# Patient Record
Sex: Male | Born: 1950 | Race: Black or African American | Hispanic: No | Marital: Single | Smoking: Current every day smoker
Health system: Southern US, Community
[De-identification: ages and names within clinical notes are randomized; demographics above are authoritative.]

## PROBLEM LIST (undated history)

## (undated) DIAGNOSIS — I10 Essential (primary) hypertension: Secondary | ICD-10-CM

## (undated) DIAGNOSIS — E785 Hyperlipidemia, unspecified: Secondary | ICD-10-CM

## (undated) HISTORY — DX: Hyperlipidemia, unspecified: E78.5

---

## 2009-11-09 ENCOUNTER — Emergency Department (HOSPITAL_COMMUNITY): Admission: EM | Admit: 2009-11-09 | Discharge: 2009-11-09 | Payer: Self-pay | Admitting: Emergency Medicine

## 2009-11-11 ENCOUNTER — Emergency Department (HOSPITAL_COMMUNITY): Admission: EM | Admit: 2009-11-11 | Discharge: 2009-11-11 | Payer: Self-pay | Admitting: Emergency Medicine

## 2009-11-11 ENCOUNTER — Encounter (INDEPENDENT_AMBULATORY_CARE_PROVIDER_SITE_OTHER): Payer: Self-pay | Admitting: *Deleted

## 2009-11-17 ENCOUNTER — Telehealth: Payer: Self-pay | Admitting: Gastroenterology

## 2009-11-17 ENCOUNTER — Emergency Department (HOSPITAL_COMMUNITY): Admission: EM | Admit: 2009-11-17 | Discharge: 2009-11-17 | Payer: Self-pay | Admitting: Emergency Medicine

## 2009-11-18 ENCOUNTER — Ambulatory Visit: Payer: Self-pay | Admitting: Gastroenterology

## 2009-11-18 DIAGNOSIS — R1012 Left upper quadrant pain: Secondary | ICD-10-CM | POA: Insufficient documentation

## 2009-11-18 DIAGNOSIS — R110 Nausea: Secondary | ICD-10-CM | POA: Insufficient documentation

## 2009-11-18 DIAGNOSIS — R634 Abnormal weight loss: Secondary | ICD-10-CM | POA: Insufficient documentation

## 2009-11-18 DIAGNOSIS — K589 Irritable bowel syndrome without diarrhea: Secondary | ICD-10-CM | POA: Insufficient documentation

## 2009-11-18 DIAGNOSIS — R933 Abnormal findings on diagnostic imaging of other parts of digestive tract: Secondary | ICD-10-CM | POA: Insufficient documentation

## 2009-11-26 ENCOUNTER — Ambulatory Visit: Payer: Self-pay | Admitting: Gastroenterology

## 2009-11-30 ENCOUNTER — Encounter: Payer: Self-pay | Admitting: Gastroenterology

## 2009-12-02 ENCOUNTER — Telehealth: Payer: Self-pay | Admitting: Gastroenterology

## 2009-12-11 ENCOUNTER — Telehealth: Payer: Self-pay | Admitting: Gastroenterology

## 2010-01-28 ENCOUNTER — Ambulatory Visit: Payer: Self-pay | Admitting: Internal Medicine

## 2010-01-28 DIAGNOSIS — A530 Latent syphilis, unspecified as early or late: Secondary | ICD-10-CM | POA: Insufficient documentation

## 2010-01-28 DIAGNOSIS — K219 Gastro-esophageal reflux disease without esophagitis: Secondary | ICD-10-CM | POA: Insufficient documentation

## 2010-01-28 DIAGNOSIS — F17200 Nicotine dependence, unspecified, uncomplicated: Secondary | ICD-10-CM | POA: Insufficient documentation

## 2010-01-28 DIAGNOSIS — I10 Essential (primary) hypertension: Secondary | ICD-10-CM | POA: Insufficient documentation

## 2010-01-28 DIAGNOSIS — N401 Enlarged prostate with lower urinary tract symptoms: Secondary | ICD-10-CM | POA: Insufficient documentation

## 2010-01-28 LAB — CONVERTED CEMR LAB
ALT: 18 units/L (ref 0–53)
Albumin: 3.8 g/dL (ref 3.5–5.2)
Amphetamine Screen, Ur: NEGATIVE
Barbiturate Quant, Ur: NEGATIVE
Basophils Relative: 1.2 % (ref 0.0–3.0)
Bilirubin Urine: NEGATIVE
Bilirubin, Direct: 0.1 mg/dL (ref 0.0–0.3)
CO2: 28 meq/L (ref 19–32)
Chloride: 111 meq/L (ref 96–112)
Eosinophils Relative: 1.5 % (ref 0.0–5.0)
HCT: 39.2 % (ref 39.0–52.0)
HCV Ab: NEGATIVE
Hemoglobin: 13.1 g/dL (ref 13.0–17.0)
Ketones, ur: NEGATIVE mg/dL
LDL Cholesterol: 107 mg/dL — ABNORMAL HIGH (ref 0–99)
Leukocytes, UA: NEGATIVE
Lymphs Abs: 1.3 10*3/uL (ref 0.7–4.0)
MCV: 85.2 fL (ref 78.0–100.0)
Marijuana Metabolite: NEGATIVE
Methadone: NEGATIVE
Monocytes Absolute: 0.5 10*3/uL (ref 0.1–1.0)
Neutro Abs: 5 10*3/uL (ref 1.4–7.7)
Neutrophils Relative %: 72.2 % (ref 43.0–77.0)
PSA: 1.54 ng/mL (ref 0.10–4.00)
Potassium: 4.4 meq/L (ref 3.5–5.1)
Propoxyphene: NEGATIVE
RBC: 4.6 M/uL (ref 4.22–5.81)
Sodium: 145 meq/L (ref 135–145)
Total CHOL/HDL Ratio: 3
Total Protein: 6.6 g/dL (ref 6.0–8.3)
WBC: 6.9 10*3/uL (ref 4.5–10.5)
pH: 6 (ref 5.0–8.0)

## 2010-01-29 ENCOUNTER — Encounter: Payer: Self-pay | Admitting: Internal Medicine

## 2010-02-02 ENCOUNTER — Telehealth: Payer: Self-pay | Admitting: Internal Medicine

## 2010-02-03 ENCOUNTER — Ambulatory Visit (HOSPITAL_COMMUNITY): Admission: RE | Admit: 2010-02-03 | Discharge: 2010-02-03 | Payer: Self-pay | Admitting: Internal Medicine

## 2010-02-03 ENCOUNTER — Telehealth: Payer: Self-pay | Admitting: Internal Medicine

## 2010-02-03 DIAGNOSIS — I774 Celiac artery compression syndrome: Secondary | ICD-10-CM | POA: Insufficient documentation

## 2010-02-24 ENCOUNTER — Ambulatory Visit: Payer: Self-pay | Admitting: Vascular Surgery

## 2010-02-24 ENCOUNTER — Encounter: Payer: Self-pay | Admitting: Internal Medicine

## 2010-02-26 ENCOUNTER — Telehealth: Payer: Self-pay | Admitting: Internal Medicine

## 2010-03-01 ENCOUNTER — Ambulatory Visit: Payer: Self-pay | Admitting: Internal Medicine

## 2010-03-01 DIAGNOSIS — K5900 Constipation, unspecified: Secondary | ICD-10-CM | POA: Insufficient documentation

## 2010-04-10 ENCOUNTER — Encounter: Payer: Self-pay | Admitting: Internal Medicine

## 2010-04-20 NOTE — Letter (Signed)
Summary: Patient Notice- Polyp Results  [facility]  [address] [place], [place] [address]   Phone: [phone]  Fax: [phone]        November 30, 2009 MRN: [id number]    MATEUS REWERTS [address] [place], [place]  [address]    Dear Mr. SEARFOSS,  I am pleased to inform you that the colon polyp(s) removed during your recent colonoscopy was (were) found to be benign (no cancer detected) upon pathologic examination.  I recommend you have a repeat colonoscopy examination in 5 years to look for recurrent polyps, as having colon polyps increases your risk for having recurrent polyps or even colon cancer in the future.  Should you develop new or worsening symptoms of abdominal pain, bowel habit changes or bleeding from the rectum or bowels, please schedule an evaluation with either your primary care physician or with me.  Continue treatment plan as outlined the day of your exam.  Please call us if you are having persistent problems or have questions about your condition that have not been fully answered at this time.  Sincerely,  Meryl Dare MD [facility]  This letter has been electronically signed by your physician.  Appended Document: Patient Notice- Polyp Results letter mailed.

## 2010-04-20 NOTE — Assessment & Plan Note (Signed)
Summary: Abd. pain   History of Present Illness Visit Type: Initial Consult Primary GI MD: Elie Goody MD [facility] Primary Provider: na Requesting Provider: Bethann Berkshire, MD Chief Complaint: Patient following up from the ER for his abdominal pain and constipation. He has been taking epson salt and ducolax. He states that this happened in 1983 and he says it was gas. He was inpt then for the extreme gas, Per patient.  History of Present Illness:   60 YO MALE NEW TO GI TODAY, REFERRED VIA [facility] ER. HE HAS HAD 3 ER VISITS IN THE PAST 2 WEEKS WITH ABDOMINAL PAIN. HE HAS NO GI HX, OTHERWISE HAS BEEN HEALTHY. HE HAD ONSET ABOUT 2 WEEKS AGO WITH EPIGASTRIC/LUQ PAIN, UP UNDER HIS RIBS. THIS HAS BEEN CONSTANT,SOMETIMES WORSE AT NIGHT,UNABLE TO LAY ON HIS LEFT SIDE. HE FEELS WORSE IMMEDIATELY AFTER EATING WITH DULL PAIN. HE FEELS FULL,LIKE NOT DIGESTING HIS FOOD. NAUSEA/NO VOMITING, WEIGHT DOWN, NOT SURE HOW MANY POUNDS BUT HAS ALSO BEEN WORKING OUTSIDE ALL SUMMER. BM'S HAVE BEEN NORMAL, NO MELENA OR HEME. HE TOOK EPSOM SALTS THE OTHER DAY TO PURGE BOWEL, NO CHANGE IN PAIN. DENIES ETOH, NO ASA/NSAIDS.  IN ER 8/30: KUB NEGATIVE &  CT ABD/PELVIS WITH CONTRAST SHOWS MULTIPLE SMALL LOW DENSITY LESIONS IN THE LIVER, ?SMALL CYSTS CANNOT R/O MALIGNANCY. LABS 8/30 CBC NORMAL CREAT 1.42, HEPATIC PANEL NORMAL, LIPASE 28. HE HAS ULTRAM FOR PAIN, AND WAS GIVEN RX FOR ZANTAC.   GI Review of Systems    Reports abdominal pain, bloating, loss of appetite, nausea, and  weight loss.     Location of  Abdominal pain: right side. Weight loss of 10 pounds over 2 weeks.   Denies acid reflux, belching, chest pain, dysphagia with liquids, dysphagia with solids, heartburn, vomiting, vomiting blood, and  weight gain.      Reports constipation.     Denies anal fissure, black tarry stools, change in bowel habit, diarrhea, diverticulosis, fecal incontinence, heme positive stool, hemorrhoids, irritable bowel syndrome, jaundice, light  color stool, liver problems, rectal bleeding, and  rectal pain. Preventive Screening-Counseling & Management  Alcohol-Tobacco     Smoking Status: current      Drug Use:  no.     Current Medications (verified): 1)  Zantac 150 Mg Tabs (Ranitidine Hcl) .Marland Kitchen.. 1 By Mouth Two Times A Day 2)  Ultram 50 Mg Tabs (Tramadol Hcl) .Marland Kitchen.. 1 By Mouth Every 4-6 Hours 3)  Epsom Salt  Gran (Magnesium Sulfate (Laxative)) .... Use As Needed  Allergies (verified): No Known Drug Allergies  Past History:  Past Medical History: Reviewed history from 11/18/2009 and no changes required. Unremarkable  Past Surgical History: Reviewed history from 11/18/2009 and no changes required. Unremarkable  Family History: Family History of Heart Disease: mother, father No FH of Colon Cancer: paternal uncle unknown type cancer  Social History: Occupation: self employed Patient currently smokes. 1.5 ppd Alcohol Use - no Illicit Drug Use - no Smoking Status:  current Drug Use:  no  Review of Systems       The patient complains of sleeping problems.  The patient denies allergy/sinus, anemia, anxiety-new, arthritis/joint pain, back pain, blood in urine, breast changes/lumps, change in vision, confusion, cough, coughing up blood, depression-new, fainting, fatigue, fever, headaches-new, hearing problems, heart murmur, heart rhythm changes, itching, muscle pains/cramps, night sweats, nosebleeds, shortness of breath, skin rash, sore throat, swelling of feet/legs, swollen lymph glands, thirst - excessive, urination - excessive, urination changes/pain, urine leakage, vision changes, and voice change.  SEE HPI  Vital Signs:  Patient profile:   60 year old male Height:      71 inches Weight:      153.8 pounds BMI:     21.53 Pulse rate:   76 / minute Pulse rhythm:   regular BP sitting:   114 / 72  (left arm) Cuff size:   regular  Vitals Entered By: Harlow Mares CMA Duncan Dull) (November 18, 2009 1:32  PM)  Physical Exam  General:  Well developed, well nourished, no acute distress.,THIN Head:  Normocephalic and atraumatic. Eyes:  PERRLA, no icterus. Lungs:  Clear throughout to auscultation. Heart:  Regular rate and rhythm; no murmurs, rubs,  or bruits. Abdomen:  SOFT,FLAT, TENDER EPIGASTRIUM/AND LUQ, NO GUARDING, NO MASS OR HSM,NO SPLASH,BS+,, NO TENDERNESS OVER LEFT LOWER RIBS Rectal:  NOT DONE,HEME NEGATIVE IN ER Neurologic:  Alert and  oriented x4;  grossly normal neurologically. Psych:  Alert and cooperative. Normal mood and affect.  Impression & Recommendations:  Problem # 1:  ABDOMINAL PAIN, LEFT UPPER QUADRANT (ICD-789.02) Assessment New 59 YO MALE WITH 2 WEEK HX OF LUQ PAIN,NAUSEA,WEIGHT LOSS . RECENT CT WITH MULTIPLE INDETERMINATE TINY LIVER LESIONS; CYSTS VS ?METS.  R/O PUD, R/O GASTRIC OR COLON LESION. USE ULTRAM AS NEEDED FOR PAIN TRIAL OF ACIPHEX 20 MG TWICE DAILY(SAMPLES GIVEN) AND HOLD ZANTAC. SMALL FREQUENT BLAND MEALS SCHEDULE FOR EGD AND COLONOSCOPY WITH DR. Russella Dar ASAP. PROCEDURES DISCUSSED IN DETAIL WITH PT, INCLUDING RISKS, BENEFITS, ALTERNATIVES AND HE IS AGREEABLE . Orders: Colon/Endo (Colon/Endo)  Problem # 2:  ABNORMAL FINDINGS GI TRACT (ICD-793.4) Assessment: Unchanged ABNORMAL CT OF LIVER AS  ABOVE. MAY NEED MRI DEPENDING ON FINDINGS OF EGD AND COLON  Problem # 3:  SMOKER Assessment: Comment Only DC SMOKING.  Patient Instructions: 1)  We have given you samples of Aciphex tablets.  Take 1 tab 30 min before breakfast anad 1 tab before dinner. 2)  Eat frequent bland, small, meals.   3)  Continue the the Ultram for pain.  4)  We scheduled the Colonoscopy/Endoscopy with Dr Russella Dar on 11-26-09. 5)  Directions and brochure provided. 6)  [facility] Patient Information Guide given to patient. 7)  The medication list was reviewed and reconciled.  All changed / newly prescribed medications were explained.  A complete medication list was provided to  the patient / caregiver.  Prescriptions: MOVIPREP 100 GM  SOLR (PEG-KCL-NACL-NASULF-NA ASC-C) As per prep instructions.  #1 x 0   Entered by:   Lowry Ram NCMA   Authorized by:   Sammuel Cooper PA-c   Signed by:   Lowry Ram NCMA on 11/18/2009   Method used:   Electronically to        [facility] [phone]* (retail)       [address]       [place], [place]  [address]       Ph: [phone]       Fax: [phone]   RxID:   [id number]

## 2010-04-20 NOTE — Progress Notes (Signed)
Summary: triage  Phone Note From Other Clinic Call back at [phone]   Caller: [facility] Misty Stanley--- Dr Bethann Berkshire Call For: DOD Reason for Call: Schedule Patient Appt Summary of Call: Dr Estell Harpin wants this patient seen tomorrow for severe abd pain possible ulcer related. Please call the patient. Initial call taken by: Tawni Levy,  November 17, 2009 3:08 PM  Follow-up for Phone Call        Misty Stanley from Er.given appt. time for pt.for 11/18/09 at 1:30 p.m. per request of Dr.Joseph Zammitt and I asked her to inform the pt.to bring $184.00 for visit as he is self-pay.Per Misty Stanley she said pt. has no G.i. hx. Follow-up by: Teryl Lucy RN,  November 17, 2009 3:39 PM

## 2010-04-20 NOTE — Procedures (Signed)
Summary: Colonoscopy  Patient: Jesse Williams Note: All result statuses are Final unless otherwise noted.  Tests: (1) Colonoscopy (COL)   COL Colonoscopy           DONE     [facility]     [address]     [place], [place]  [address]           COLONOSCOPY PROCEDURE REPORT           PATIENT:  Jesse Williams, Jesse Williams  MR#:  [id number]     BIRTHDATE:  Apr 13, 1950, 59 yrs. old  GENDER:  male     ENDOSCOPIST:  Judie Petit T. Russella Dar, MD, [facility]     Referred by:  Valeria Batman, M.D.     PROCEDURE DATE:  11/26/2009     PROCEDURE:  Colonoscopy with biopsy and snare polypectomy     ASA CLASS:  Class II     INDICATIONS:  1) Abnormal CT of abdomen  2) weight loss     MEDICATIONS:   Fentanyl 75 mcg IV, Versed 7 mg IV     DESCRIPTION OF PROCEDURE:   After the risks benefits and     alternatives of the procedure were thoroughly explained, informed     consent was obtained.  Digital rectal exam was performed and     revealed no abnormalities.   The LB [id number]-[id number] endoscope     was introduced through the anus and advanced to the cecum, which     was identified by both the appendix and ileocecal valve, without     limitations.  The quality of the prep was good, using MoviPrep.     The instrument was then slowly withdrawn as the colon was fully     examined.     <<PROCEDUREIMAGES>>     FINDINGS:  A sessile polyp was found at the hepatic flexure. It     was 4 mm in size. The polyp was removed using cold biopsy forceps.     A sessile polyp was found in the proximal transverse colon. It was     5 mm in size. Polyp was snared without cautery. Retrieval was     successful. A pedunculated polyp was found in the descending     colon. It was 9 mm in size. Polyp was snared, then cauterized with     monopolar cautery. Retrieval was successful.   Mild diverticulosis     was found in the sigmoid colon. This was otherwise a normal     examination of the colon.  Retroflexed views in the rectum     revealed  internal hemorrhoids, small.  The time to cecum =  3.5     minutes. The scope was then withdrawn (time =  9.5  min) from the     patient and the procedure completed.           COMPLICATIONS:  None           ENDOSCOPIC IMPRESSION:     1) 4 mm sessile polyp at the hepatic flexure     2) 5 mm sessile polyp in the proximal transverse colon     3) 9 mm pedunculated polyp in the descending colon     4) Mild diverticulosis     5) Internal hemorrhoids     RECOMMENDATIONS:     1) No aspirin or NSAID's for 2 weeks     2) Await pathology results     3) GasX qid prn  Venita Lick. Russella Dar, MD, [facility]           n.     eSIGNED:   Venita Lick. Stark at 11/26/2009 08:35 AM           [name], [id number]  Note: An exclamation mark (!) indicates a result that was not dispersed into the flowsheet. Document Creation Date: 11/26/2009 8:36 AM _______________________________________________________________________  (1) Order result status: Final Collection or observation date-time: 11/26/2009 08:22 Requested date-time:  Receipt date-time:  Reported date-time:  Referring Physician:   Ordering Physician: Claudette Head [phone]) Specimen Source:  Source: [name] Order Number: [id number] Lab site:   Appended Document: Colonoscopy     Procedures Next Due Date:    Colonoscopy: 11/2014

## 2010-04-20 NOTE — Progress Notes (Signed)
Summary: MRA  Phone Note From Other Clinic   Caller: [facility], [facility] Summary of Call: Clifton Custard with [facility] called to inform pt. is scheduled for MRA tomorrow ( 02/03/2010) of the Abdomen and Pelvis. He informed that doing just the abdomen should be able to cover what he needs and possible a MRA of pelvis not necessary. Please Advise. Call back number [phone] Initial call taken by: Robin Ewing CMA Duncan Dull),  February 02, 2010 1:10 PM  Follow-up for Phone Call        ok to change to MRA abd only Follow-up by: Corwin Levins MD,  February 02, 2010 1:12 PM  Additional Follow-up for Phone Call Additional follow up Details #1::        New order in EMR [facility] aware Additional Follow-up by: Lamar Sprinkles, CMA,  February 02, 2010 2:59 PM

## 2010-04-20 NOTE — Progress Notes (Signed)
     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

## 2010-04-20 NOTE — Letter (Signed)
Summary: [facility] Instructions  [facility]  [address] [place], [place] [address]   Phone: [phone]  Fax: [phone]       FREEDOM PEDDY    Feb 28, 1963    MRN: [id number]        Procedure Day /Date: 11-26-09     Arrival Time: 7:30 AM      Procedure Time :8:00 AM     Location of Procedure:                    X     [facility] (4th Floor)                    PREPARATION FOR COLONOSCOPY WITH MOVIPREP   Starting 5 days prior to your procedure 11-21-09 do not eat nuts, seeds, popcorn, corn, beans, peas,  salads, or any raw vegetables.  Do not take any fiber supplements (e.g. Metamucil, Citrucel, and Benefiber).  THE DAY BEFORE YOUR PROCEDURE         DATE: 11-25-09  DAY: WEDNESDAY  1.  Drink clear liquids the entire day-NO SOLID FOOD  2.  Do not drink anything colored red or purple.  Avoid juices with pulp.  No orange juice.  3.  Drink at least 64 oz. (8 glasses) of fluid/clear liquids during the day to prevent dehydration and help the prep work efficiently.  CLEAR LIQUIDS INCLUDE: Water Jello Ice Popsicles Tea (sugar ok, no milk/cream) Powdered fruit flavored drinks Coffee (sugar ok, no milk/cream) Gatorade Juice: apple, white grape, white cranberry  Lemonade Clear bullion, consomm, broth Carbonated beverages (any kind) Strained chicken noodle soup Hard Candy                             4.  In the morning, mix first dose of MoviPrep solution:    Empty 1 Pouch A and 1 Pouch B into the disposable container    Add lukewarm drinking water to the top line of the container. Mix to dissolve    Refrigerate (mixed solution should be used within 24 hrs)  5.  Begin drinking the prep at 5:00 p.m. The MoviPrep container is divided by 4 marks.   Every 15 minutes drink the solution down to the next mark (approximately 8 oz) until the full liter is complete.   6.  Follow completed prep with 16 oz of clear liquid of your choice (Nothing red or  purple).  Continue to drink clear liquids until bedtime.  7.  Before going to bed, mix second dose of MoviPrep solution:    Empty 1 Pouch A and 1 Pouch B into the disposable container    Add lukewarm drinking water to the top line of the container. Mix to dissolve    Refrigerate  THE DAY OF YOUR PROCEDURE      DATE: 11-26-09 DAY: THURSDAY  Beginning at 3:00 am (5 hours before procedure):         1. Every 15 minutes, drink the solution down to the next mark (approx 8 oz) until the full liter is complete.  2. Follow completed prep with 16 oz. of clear liquid of your choice.    3. You may drink clear liquids until 6:00 am  (2 HOURS BEFORE PROCEDURE).   MEDICATION INSTRUCTIONS  Unless otherwise instructed, you should take regular prescription medications with a small sip of water   as early as possible the morning of your  procedure.       OTHER INSTRUCTIONS  You will need a responsible adult at least 62 years of age to accompany you and drive you home.   This person must remain in the waiting room during your procedure.  Wear loose fitting clothing that is easily removed.  Leave jewelry and other valuables at home.  However, you may wish to bring a book to read or  an iPod/MP3 player to listen to music as you wait for your procedure to start.  Remove all body piercing jewelry and leave at home.  Total time from sign-in until discharge is approximately 2-3 hours.  You should go home directly after your procedure and rest.  You can resume normal activities the  day after your procedure.  The day of your procedure you should not:   Drive   Make legal decisions   Operate machinery   Drink alcohol   Return to work  You will receive specific instructions about eating, activities and medications before you leave.    The above instructions have been reviewed and explained to me by   _______________________    I fully understand and can verbalize these instructions  _____________________________ Date _________

## 2010-04-20 NOTE — Assessment & Plan Note (Signed)
Summary: New / [facility] / # / cd   Vital Signs:  Patient profile:   60 year old male Height:      71 inches Weight:      171 pounds BMI:     23.94 O2 Sat:      94 % on Room air Temp:     98.0 degrees F oral Pulse rate:   74 / minute Pulse rhythm:   regular Resp:     16 per minute BP sitting:   138 / 88  (left arm) Cuff size:   large  Vitals Entered By: Rock Nephew CMA (January 28, 2010 4:19 PM)  O2 Flow:  Room air CC: New to establish, Preventive Care, Abdominal Pain   Primary Care Provider:  Etta Grandchild MD  CC:  New to establish, Preventive Care, and Abdominal Pain.  History of Present Illness: New to me he complains of epigastric and LUQ post-prandial abd pain for about 3 months. He has had an unremarkable CT scan and pan-endoscopy yet his symptoms persist. He has lost an unspecified amount of weight.  Dyspepsia History:      The patient has positive alarm features of dyspepsia which include involuntary weight loss >5%.  There is a prior history of GERD.  The patient does not have a prior history of documented ulcer disease.  The dominant symptom is heartburn or acid reflux.  An H-2 blocker medication is currently being taken.  He notes that the symptoms have improved with the H-2 blocker therapy.  Symptoms have not persisted after 4 weeks of H-2 blocker treatment.  A prior EGD has been done.     Preventive Screening-Counseling & Management  Alcohol-Tobacco     Alcohol drinks/day: <1     Alcohol type: spirits     >5/day in last 3 mos: no     Alcohol Counseling: not indicated; use of alcohol is not excessive or problematic     Feels need to cut down: no     Feels annoyed by complaints: no     Feels guilty re: drinking: no     Needs 'eye opener' in am: no     Smoking Status: current     Smoking Cessation Counseling: yes     Smoke Cessation Stage: precontemplative     Packs/Day: 1.5     Year Started: 1966     Pack years: 40     Tobacco  Counseling: to quit use of tobacco products  Hep-HIV-STD-Contraception     Hepatitis Risk: no risk noted     HIV Risk: no risk noted     STD Risk: no risk noted      Sexual History:  currently monogamous.        Drug Use:  current, crack cocaine, and marijuana.        Blood Transfusions:  no.    Current Medications (verified): 1)  Ultram 50 Mg Tabs (Tramadol Hcl) .Marland Kitchen.. 1 By Mouth Every 4-6 Hours 2)  Epsom Salt  Gran (Magnesium Sulfate (Laxative)) .... Use As Needed 3)  Prilosec Otc 20 Mg  Tbec (Omeprazole Magnesium) .Marland Kitchen.. 1 Each Day 30 Minutes Before Breakfast.  Allergies (verified): No Known Drug Allergies  Past History:  Past Medical History: Hypertension GERD Syphyllis  Past Surgical History: Reviewed history from 11/18/2009 and no changes required. Unremarkable  Social History: Packs/Day:  1.5 Hepatitis Risk:  no risk noted HIV Risk:  no risk noted STD Risk:  no risk noted Drug  Use:  current, crack cocaine, marijuana Sexual History:  currently monogamous Blood Transfusions:  no  Review of Systems       The patient complains of anorexia, weight loss, and abdominal pain.  The patient denies fever, hoarseness, chest pain, syncope, dyspnea on exertion, peripheral edema, prolonged cough, headaches, hemoptysis, melena, hematochezia, severe indigestion/heartburn, hematuria, genital sores, suspicious skin lesions, difficulty walking, depression, abnormal bleeding, enlarged lymph nodes, angioedema, and testicular masses.   General:  Complains of loss of appetite; denies chills, fatigue, fever, malaise, sleep disorder, sweats, weakness, and weight loss. GI:  Complains of abdominal pain, constipation, gas, and loss of appetite; denies bloody stools, dark tarry stools, hemorrhoids, indigestion, nausea, vomiting, vomiting blood, and yellowish skin color. GU:  Complains of nocturia, urinary frequency, and urinary hesitancy; denies discharge, dysuria, and hematuria.  Physical  Exam  General:  alert, well-developed, well-nourished, well-hydrated, appropriate dress, normal appearance, healthy-appearing, cooperative to examination, and good hygiene.   Head:  normocephalic, atraumatic, no abnormalities observed, and no abnormalities palpated.   Eyes:  vision grossly intact, pupils equal, pupils round, and pupils reactive to light.   Nose:  External nasal examination shows no deformity or inflammation. Nasal mucosa are pink and moist without lesions or exudates. Mouth:  Oral mucosa and oropharynx without lesions or exudates.  Teeth in good repair. Neck:  supple, full ROM, no masses, no thyromegaly, no JVD, normal carotid upstroke, no carotid bruits, no cervical lymphadenopathy, and no neck tenderness.   Breasts:  No masses or gynecomastia noted Lungs:  normal respiratory effort, no intercostal retractions, no accessory muscle use, normal breath sounds, no dullness, no fremitus, no crackles, and no wheezes.   Heart:  normal rate, regular rhythm, no murmur, no gallop, no rub, and no JVD.   Abdomen:  soft, non-tender, normal bowel sounds, no distention, no masses, no guarding, no rigidity, no rebound tenderness, no abdominal hernia, no inguinal hernia, no hepatomegaly, and no splenomegaly.   Rectal:  No external abnormalities noted. Normal sphincter tone. No rectal masses or tenderness. Genitalia:  circumcised, no hydrocele, no varicocele, no scrotal masses, no testicular masses or atrophy, no cutaneous lesions, and no urethral discharge.   Prostate:  no nodules, no asymmetry, no induration, and 2+ enlarged.   Msk:  normal ROM, no joint tenderness, no joint swelling, no joint warmth, no redness over joints, no joint deformities, no joint instability, no crepitation, and no muscle atrophy.   Pulses:  R and L carotid,radial,femoral,dorsalis pedis and posterior tibial pulses are full and equal bilaterally Extremities:  No clubbing, cyanosis, edema, or deformity noted with normal  full range of motion of all joints.   Neurologic:  No cranial nerve deficits noted. Station and gait are normal. Plantar reflexes are down-going bilaterally. DTRs are symmetrical throughout. Sensory, motor and coordinative functions appear intact. Skin:  turgor normal, color normal, no rashes, no suspicious lesions, no ecchymoses, no petechiae, no purpura, no ulcerations, and no edema.   Cervical Nodes:  no anterior cervical adenopathy and no posterior cervical adenopathy.   Axillary Nodes:  no R axillary adenopathy and no L axillary adenopathy.   Inguinal Nodes:  no R inguinal adenopathy and no L inguinal adenopathy.   Psych:  Cognition and judgment appear intact. Alert and cooperative with normal attention span and concentration. No apparent delusions, illusions, hallucinations   Impression & Recommendations:  Problem # 1:  SYPHILIS, LATENT (ICD-097.1) Assessment New  Orders: T-Drug Screen-Urine, (single) [phone]) T-HIV Antibody  (Reflex) [id number]) T-RPR (Syphilis) (30865-78469) T-Hepatitis C Antibody (  [phone]) Venipuncture [phone]) TLB-Lipid Panel (80061-LIPID) TLB-BMP (Basic Metabolic Panel-BMET) (80048-METABOL) TLB-CBC Platelet - w/Differential (85025-CBCD) TLB-Hepatic/Liver Function Pnl (80076-HEPATIC) TLB-TSH (Thyroid Stimulating Hormone) (84443-TSH) TLB-PSA (Prostate Specific Antigen) (84153-PSA) TLB-Udip w/ Micro (81001-URINE)  Problem # 2:  LOSS OF WEIGHT (WNU-272.53) Assessment: Unchanged  Orders: T-Drug Screen-Urine, (single) (66440-34742) T-HIV Antibody  (Reflex) [id number]) T-RPR (Syphilis) (33295-18841) T-Hepatitis C Antibody (66063-01601) Venipuncture (09323) TLB-Lipid Panel (80061-LIPID) TLB-BMP (Basic Metabolic Panel-BMET) (80048-METABOL) TLB-CBC Platelet - w/Differential (85025-CBCD) TLB-Hepatic/Liver Function Pnl (80076-HEPATIC) TLB-TSH (Thyroid Stimulating Hormone) (84443-TSH) TLB-PSA (Prostate Specific Antigen) (84153-PSA) TLB-Udip w/  Micro (81001-URINE)  Problem # 3:  HYPERTENSION (ICD-401.9) Assessment: Improved  Orders: T-Drug Screen-Urine, (single) [phone]) T-HIV Antibody  (Reflex) [id number]) T-RPR (Syphilis) [phone]) T-Hepatitis C Antibody (37106-26948) Venipuncture (54627) TLB-Lipid Panel (80061-LIPID) TLB-BMP (Basic Metabolic Panel-BMET) (80048-METABOL) TLB-CBC Platelet - w/Differential (85025-CBCD) TLB-Hepatic/Liver Function Pnl (80076-HEPATIC) TLB-TSH (Thyroid Stimulating Hormone) (84443-TSH) TLB-PSA (Prostate Specific Antigen) (84153-PSA) TLB-Udip w/ Micro (81001-URINE) Tobacco use cessation intermediate 3-10 minutes (99406)  BP today: 138/88 Prior BP: 114/72 (11/18/2009)  Problem # 4:  ABDOMINAL PAIN, LEFT UPPER QUADRANT (ICD-789.02) Assessment: Unchanged I think he may have mesenteric ischemia, will order an MR angio Orders: T-Drug Screen-Urine, (single) (03500-93818) T-HIV Antibody  (Reflex) [id number]) T-RPR (Syphilis) (89381-01751) T-Hepatitis C Antibody (02585-27782) Venipuncture (42353) TLB-Lipid Panel (80061-LIPID) TLB-BMP (Basic Metabolic Panel-BMET) (80048-METABOL) TLB-CBC Platelet - w/Differential (85025-CBCD) TLB-Hepatic/Liver Function Pnl (80076-HEPATIC) TLB-TSH (Thyroid Stimulating Hormone) (84443-TSH) TLB-PSA (Prostate Specific Antigen) (84153-PSA) TLB-Udip w/ Micro (81001-URINE) Radiology Referral (Radiology) Hemoccult Guaiac-1 spec.(in office) (82270)  Problem # 5:  TOBACCO USE (ICD-305.1) Assessment: New  Encouraged smoking cessation and discussed different methods for smoking cessation.   Orders: Tobacco use cessation intermediate 3-10 minutes (61443)  Problem # 6:  HYPERTROPHY PROSTATE W/UR OBST & OTH LUTS (ICD-600.01) Assessment: New  Complete Medication List: 1)  Ultram 50 Mg Tabs (Tramadol hcl) .Marland Kitchen.. 1 by mouth every 4-6 hours 2)  Epsom Salt Gran (Magnesium sulfate (laxative)) .... Use as needed 3)  Prilosec Otc 20 Mg Tbec (Omeprazole magnesium)  .Marland Kitchen.. 1 each day 30 minutes before breakfast.  Colorectal Screening:  Current Recommendations:    Hemoccult: NEG X 1 today  PSA Screening:    Reviewed PSA screening recommendations: PSA ordered  Patient Instructions: 1)  Please schedule a follow-up appointment in 2 weeks. 2)  Tobacco is very bad for your health and your loved ones! You Should stop smoking!. 3)  Stop Smoking Tips: Choose a Quit date. Cut down before the Quit date. decide what you will do as a substitute when you feel the urge to smoke(gum,toothpick,exercise). 4)  Check your Blood Pressure regularly. If it is above 140/90: you should make an appointment.   Orders Added: 1)  T-Drug Screen-Urine, (single) [80101-82900] 2)  T-HIV Antibody  (Reflex) [[id number]] 3)  T-RPR (Syphilis) [95093-26712] 4)  T-Hepatitis C Antibody [45809-98338] 5)  Venipuncture [36415] 6)  TLB-Lipid Panel [80061-LIPID] 7)  TLB-BMP (Basic Metabolic Panel-BMET) [80048-METABOL] 8)  TLB-CBC Platelet - w/Differential [85025-CBCD] 9)  TLB-Hepatic/Liver Function Pnl [80076-HEPATIC] 10)  TLB-TSH (Thyroid Stimulating Hormone) [84443-TSH] 11)  TLB-PSA (Prostate Specific Antigen) [84153-PSA] 12)  TLB-Udip w/ Micro [81001-URINE] 13)  Radiology Referral [Radiology] 14)  New Patient Level V [99205] 15)  Hemoccult Guaiac-1 spec.(in office) [82270] 16)  Tobacco use cessation intermediate 3-10 minutes [99406]

## 2010-04-20 NOTE — Letter (Signed)
Summary: Lipid Letter  [facility]  [address] [place], [place] [address]   Phone: [phone]  Fax: [phone]    01/29/2010  Jerimiah Wolman [address] [place], [place]  [address]  Dear Peyton Najjar:  We have carefully reviewed your last lipid profile from 01/28/2010 and the results are noted below with a summary of recommendations for lipid management.    Cholesterol:       184     Goal: <200   HDL "good" Cholesterol:   62.95     Goal: >40   LDL "bad" Cholesterol:   107     Goal: <130   Triglycerides:       55.0     Goal: <150    other labs look good, prostate test is normal    TLC Diet (Therapeutic Lifestyle Change): Saturated Fats & Transfatty acids should be kept < 7% of total calories ***Reduce Saturated Fats Polyunstaurated Fat can be up to 10% of total calories Monounsaturated Fat Fat can be up to 20% of total calories Total Fat should be no greater than 25-35% of total calories Carbohydrates should be 50-60% of total calories Protein should be approximately 15% of total calories Fiber should be at least 20-30 grams a day ***Increased fiber may help lower LDL Total Cholesterol should be < 200mg /day Consider adding plant stanol/sterols to diet (example: Benacol spread) ***A higher intake of unsaturated fat may reduce Triglycerides and Increase HDL    Adjunctive Measures (may lower LIPIDS and reduce risk of Heart Attack) include: Aerobic Exercise (20-30 minutes 3-4 times a week) Limit Alcohol Consumption Weight Reduction Aspirin 75-81 mg a day by mouth (if not allergic or contraindicated) Dietary Fiber 20-30 grams a day by mouth     Current Medications: 1)    Ultram 50 Mg Tabs (Tramadol hcl) .Marland Kitchen.. 1 by mouth every 4-6 hours 2)    Epsom Salt  Gran (Magnesium sulfate (laxative)) .... Use as needed 3)    Prilosec Otc 20 Mg  Tbec (Omeprazole magnesium) .Marland Kitchen.. 1 each day 30 minutes before breakfast.  If you have any questions, please call. We appreciate  being able to work with you.   Sincerely,    [facility] Etta Grandchild MD

## 2010-04-20 NOTE — Progress Notes (Signed)
Summary: triage  Phone Note Call from Patient Call back at [phone]   Caller: neighbor, Jamesetta So Call For: Dr. Russella Dar Reason for Call: Talk to Nurse Summary of Call: pt still having moving abd pain and sometimes back pain... pt also says he is still waiting on biopsy results... constipated and wants to know if he can take a laxative Initial call taken by: Vallarie Mare,  December 02, 2009 10:27 AM  Follow-up for Phone Call        I have spoke with the patient .  C/O continued abdominal pain and gas. He has not been consistently taking gas-x, or Prilosec.  He did have a small BM last night.  He is advised to start Miralax 1-3 times a day as needed for constipation.  Reviewed path results with him and a low gas diet.  He will call back for continued problems.   Follow-up by: Darcey Nora RN, CGRN,  December 02, 2009 11:14 AM  Additional Follow-up for Phone Call Additional follow up Details #1::        Agree. Additional Follow-up by: Meryl Dare MD Clementeen Graham,  December 02, 2009 2:57 PM

## 2010-04-20 NOTE — Progress Notes (Signed)
Summary: Triage  Phone Note Call from Patient   Caller: Jesse Williams   [phone] Call For: Dr. Russella Dar Reason for Call: Talk to Nurse Summary of Call: pt. is having left side rib pain Initial call taken by: Karna Christmas,  December 11, 2009 1:31 PM  Follow-up for Phone Call        Pt. had an Endo/Colon 11-26-09. Pt. continues with same c/o for 2 monthes. Pain under his Left ribs that goes around to his back. Pain is always there at night and when he lays still, is relieved somewhat when he moves around. Some bloating, and  can only have a BM when he takes Epsom Salts. Denies n/v, diarhea, blood, black stools, fever. Takes Prilosec 20mg  QAM. (needs script sent to pharmacy for Prilosec)  DR.Leopoldo Mazzie PLEASE ADVISE  Follow-up by: Laureen Ochs LPN,  December 11, 2009 2:02 PM  Additional Follow-up for Phone Call Additional follow up Details #1::        Add Gas-X qid as needed for gas and bloating Miralax once daily for constipation Needs PCP evaluation for possible left sided musculoskelatal pain, no GI disorders found on recent work up to account for the pain Additional Follow-up by: Meryl Dare MD Clementeen Graham,  December 11, 2009 2:10 PM    Additional Follow-up for Phone Call Additional follow up Details #2::    Above MD orders reviewed with patient. I transfered his call to [facility] for him to schedule an appt. to see a PCP. Pt. instructed to call back as needed.  Follow-up by: Laureen Ochs LPN,  December 11, 2009 2:38 PM  New/Updated Medications: PRILOSEC OTC 20 MG  TBEC (OMEPRAZOLE MAGNESIUM) 1 each day 30 minutes before breakfast. Prescriptions: PRILOSEC OTC 20 MG  TBEC (OMEPRAZOLE MAGNESIUM) 1 each day 30 minutes before breakfast.  #30 x 2   Entered by:   Laureen Ochs LPN   Authorized by:   Meryl Dare MD [facility]   Signed by:   Laureen Ochs LPN on 46/96/2952   Method used:   Electronically to        [facility] [phone]* (retail)       [address]       [place], [place]  [address]       Ph: [phone]       Fax: [phone]   RxID:   [id number]

## 2010-04-20 NOTE — Procedures (Signed)
Summary: Upper Endoscopy  Patient: [name] Note: All result statuses are Final unless otherwise noted.  Tests: (1) Upper Endoscopy (EGD)   EGD Upper Endoscopy       DONE     [facility]     [address]     [place], [place]  [address]           ENDOSCOPY PROCEDURE REPORT           PATIENT:  Jesse Williams, Jesse Williams  MR#:  [id number]     BIRTHDATE:  October 09, 1950, 59 yrs. old  GENDER:  male     ENDOSCOPIST:  Judie Petit T. Russella Dar, MD, [facility]           PROCEDURE DATE:  11/26/2009     PROCEDURE:  EGD, diagnostic     ASA CLASS:  Class II     INDICATIONS:  abdominal pain, left upper quadr, abnormal imaging,     weight loss     MEDICATIONS:  There was residual sedation effect present from     prior procedure, Versed 1 mg IV     TOPICAL ANESTHETIC:  Exactacain Spray     DESCRIPTION OF PROCEDURE:   After the risks benefits and     alternatives of the procedure were thoroughly explained, informed     consent was obtained.  The LB [id number] endoscope was     introduced through the mouth and advanced to the second portion of     the duodenum, without limitations.  The instrument was slowly     withdrawn as the mucosa was fully examined.     <<PROCEDUREIMAGES>>     The esophagus and gastroesophageal junction were completely normal     in appearance.  The stomach was entered and closely examined. The     pylorus, antrum, angularis, and lesser curvature were well     visualized, including a retroflexed view of the cardia and fundus.     The stomach wall was normally distensable. The scope passed easily     through the pylorus into the duodenum. The duodenal bulb was     normal in appearance, as was the postbulbar duodenum.  Retroflexed     views revealed no abnormalities.  The scope was then withdrawn     from the patient and the procedure completed.           COMPLICATIONS:  None           ENDOSCOPIC IMPRESSION:     1) Normal EGD           RECOMMENDATIONS:     1) Anti-reflux  regimen     2) PPI qam: Prilosec OTC 20mg  po qam for 2 months     3) DC Zantac while on Prilosec           Renatha Rosen T. Russella Dar, MD, Clementeen Graham           n.     eSIGNED:   Venita Lick. Wildon Cuevas at 11/26/2009 08:47 AM           [name], [id number]  Note: An exclamation mark (!) indicates a result that was not dispersed into the flowsheet. Document Creation Date: 11/26/2009 8:48 AM _______________________________________________________________________  (1) Order result status: Final Collection or observation date-time: 11/26/2009 08:28 Requested date-time:  Receipt date-time:  Reported date-time:  Referring Physician:   Ordering Physician: Claudette Head [phone]) Specimen Source:  Source: [name] Order Number: [id number] Lab site:

## 2010-04-22 NOTE — Consult Note (Signed)
Summary: [facility]  [facility]   Imported By: Sherian Rein 03/09/2010 08:16:16  _____________________________________________________________________  External Attachment:    Type:   Image     Comment:   External Document

## 2010-04-22 NOTE — Progress Notes (Signed)
Summary: ABD PAIN   Phone Note Call from Patient Call back at Home Phone [phone]   Summary of Call: Pt continues have abd discomfort. Vascular MD advised pt there was nothing to be done. Scheduled for office visit next week, anything further while waiting on office visit?  Initial call taken by: Lamar Sprinkles, CMA,  February 26, 2010 9:48 AM  Follow-up for Phone Call        there is nothing else for me to [facility] either, please ask him to stop using cocaine and go to an narcotics or cocaine anonymous meeting Follow-up by: Etta Grandchild MD,  February 26, 2010 10:09 AM  Additional Follow-up for Phone Call Additional follow up Details #1::        Pt seen in office today Additional Follow-up by: Lamar Sprinkles, CMA,  March 01, 2010 12:17 PM

## 2010-04-22 NOTE — Assessment & Plan Note (Signed)
Summary: L LOWER ABD PAIN W/BOTH SIDE BACK RIB CAGE PAIN--PER SARAH Snyder...   Vital Signs:  Patient profile:   60 year old male Height:      71 inches Weight:      170 pounds BMI:     23.80 O2 Sat:      98 % on Room air Temp:     97.4 degrees F oral Pulse rate:   72 / minute Pulse rhythm:   regular Resp:     16 per minute BP sitting:   130 / 88  (left arm) Cuff size:   regular  Vitals Entered By: Lanier Prude, CMA(AAMA) (March 01, 2010 11:38 AM)  O2 Flow:  Room air CC: abd pain worsening X 3 wks and constipation, Abdominal pain Is Patient Diabetic? No Pain Assessment Patient in pain? no        Primary Care Provider:  Etta Grandchild MD  CC:  abd pain worsening X 3 wks and constipation and Abdominal pain.  History of Present Illness:  Abdominal Pain      This is a 60 year old man who presents with Abdominal pain.  The symptoms began 3 weeks ago and has since resolved.  On a scale of 1 to 10, the intensity is described as a 2.  The patient reports constipation, but denies nausea, vomiting, diarrhea, melena, hematochezia, anorexia, and hematemesis.  The location of the pain is left lower quadrant.  The pain is described as intermittent.  The patient denies the following symptoms: fever, weight loss, dysuria, chest pain, jaundice, and dark urine.  The pain is better with defecation.  He tells me that he saw a  ? surgeon and he was told that he needs a procedure with dye but he does not want to do the procedure and that the surgeon says that nothing else needs to be done. I do not have any records of that visit.   Preventive Screening-Counseling & Management  Alcohol-Tobacco     Alcohol drinks/day: <1     Alcohol type: spirits     >5/day in last 3 mos: no     Alcohol Counseling: not indicated; use of alcohol is not excessive or problematic     Feels need to cut down: no     Feels annoyed by complaints: no     Feels guilty re: drinking: no     Needs 'eye opener' in am:  no     Smoking Status: current     Smoking Cessation Counseling: yes     Smoke Cessation Stage: precontemplative     Packs/Day: 1.5     Year Started: 1966     Pack years: 40     Tobacco Counseling: to quit use of tobacco products  Hep-HIV-STD-Contraception     Hepatitis Risk: no risk noted     HIV Risk: no risk noted     STD Risk: no risk noted      Sexual History:  currently monogamous.        Drug Use:  yes.        Blood Transfusions:  no.    Clinical Review Panels:  Prevention   Last Colonoscopy:  DONE (11/26/2009)   Last PSA:  1.54 (01/28/2010)  Lipid Management   Cholesterol:  184 (01/28/2010)   LDL (bad choesterol):  107 (01/28/2010)   HDL (good cholesterol):  66.50 (01/28/2010)  Diabetes Management   Creatinine:  1.1 (01/28/2010)  CBC   WBC:  6.9 (01/28/2010)  RBC:  4.60 (01/28/2010)   Hgb:  13.1 (01/28/2010)   Hct:  39.2 (01/28/2010)   Platelets:  198.0 (01/28/2010)   MCV  85.2 (01/28/2010)   MCHC  33.5 (01/28/2010)   RDW  16.4 (01/28/2010)   PMN:  72.2 (01/28/2010)   Lymphs:  18.2 (01/28/2010)   Monos:  6.9 (01/28/2010)   Eosinophils:  1.5 (01/28/2010)   Basophil:  1.2 (01/28/2010)  Complete Metabolic Panel   Glucose:  70 (01/28/2010)   Sodium:  145 (01/28/2010)   Potassium:  4.4 (01/28/2010)   Chloride:  111 (01/28/2010)   CO2:  28 (01/28/2010)   BUN:  15 (01/28/2010)   Creatinine:  1.1 (01/28/2010)   Albumin:  3.8 (01/28/2010)   Total Protein:  6.6 (01/28/2010)   Calcium:  9.7 (01/28/2010)   Total Bili:  0.5 (01/28/2010)   Alk Phos:  60 (01/28/2010)   SGPT (ALT):  18 (01/28/2010)   SGOT (AST):  21 (01/28/2010)   Medications Prior to Update: 1)  Ultram 50 Mg Tabs (Tramadol Hcl) .Marland Kitchen.. 1 By Mouth Every 4-6 Hours 2)  Epsom Salt  Gran (Magnesium Sulfate (Laxative)) .... Use As Needed 3)  Prilosec Otc 20 Mg  Tbec (Omeprazole Magnesium) .Marland Kitchen.. 1 Each Day 30 Minutes Before Breakfast.  Current Medications (verified): 1)  Epsom Salt  Gran  (Magnesium Sulfate (Laxative)) .... Use As Needed 2)  Metamucil Plus Calcium  Caps (Psyllium-Calcium) .Marland Kitchen.. 1 By Mouth Once Daily 3)  Miralax  Powd (Polyethylene Glycol 3350) .... One Scoop in 8 Ounces of Juice Once Daily  Allergies (verified): No Known Drug Allergies  Past History:  Past Medical History: Last updated: 01/28/2010 Hypertension GERD Syphyllis  Past Surgical History: Last updated: 11/18/2009 Unremarkable  Family History: Last updated: 11/18/2009 Family History of Heart Disease: mother, father No FH of Colon Cancer: paternal uncle unknown type cancer  Social History: Last updated: 03/01/2010 Occupation: self employed Patient currently smokes. 1.5 ppd Alcohol Use - no Drug use-yes  Risk Factors: Alcohol Use: <1 (03/01/2010) >5 drinks/d w/in last 3 months: no (03/01/2010)  Risk Factors: Smoking Status: current (03/01/2010) Packs/Day: 1.5 (03/01/2010)  Family History: Reviewed history from 11/18/2009 and no changes required. Family History of Heart Disease: mother, father No FH of Colon Cancer: paternal uncle unknown type cancer  Social History: Reviewed history from 11/18/2009 and no changes required. Occupation: self employed Patient currently smokes. 1.5 ppd Alcohol Use - no Drug use-yes Drug Use:  yes  Review of Systems  The patient denies anorexia, fever, weight loss, weight gain, chest pain, syncope, dyspnea on exertion, peripheral edema, prolonged cough, headaches, hemoptysis, melena, hematochezia, severe indigestion/heartburn, hematuria, enlarged lymph nodes, and angioedema.    Physical Exam  General:  alert, well-developed, well-nourished, well-hydrated, appropriate dress, normal appearance, healthy-appearing, cooperative to examination, and good hygiene.   Mouth:  Oral mucosa and oropharynx without lesions or exudates.  Teeth in good repair. Neck:  supple, full ROM, no masses, no thyromegaly, no JVD, normal carotid upstroke, no carotid  bruits, no cervical lymphadenopathy, and no neck tenderness.   Lungs:  normal respiratory effort, no intercostal retractions, no accessory muscle use, normal breath sounds, no dullness, no fremitus, no crackles, and no wheezes.   Heart:  normal rate, regular rhythm, no murmur, no gallop, no rub, and no JVD.   Abdomen:  soft, non-tender, normal bowel sounds, no distention, no masses, no guarding, no rigidity, no rebound tenderness, no abdominal hernia, no inguinal hernia, no hepatomegaly, and no splenomegaly.   Msk:  normal ROM, no  joint tenderness, no joint swelling, no joint warmth, no redness over joints, no joint deformities, no joint instability, no crepitation, and no muscle atrophy.   Pulses:  R and L carotid,radial,femoral,dorsalis pedis and posterior tibial pulses are full and equal bilaterally Extremities:  No clubbing, cyanosis, edema, or deformity noted with normal full range of motion of all joints.   Neurologic:  No cranial nerve deficits noted. Station and gait are normal. Plantar reflexes are down-going bilaterally. DTRs are symmetrical throughout. Sensory, motor and coordinative functions appear intact. Skin:  turgor normal, color normal, no rashes, no suspicious lesions, no ecchymoses, no petechiae, no purpura, no ulcerations, and no edema.   Cervical Nodes:  no anterior cervical adenopathy and no posterior cervical adenopathy.   Psych:  Cognition and judgment appear intact. Alert and cooperative with normal attention span and concentration. No apparent delusions, illusions, hallucinations   Impression & Recommendations:  Problem # 1:  CONSTIPATION (ICD-564.00) Assessment New  His updated medication list for this problem includes:    Epsom Salt Gran (Magnesium sulfate (laxative)) ..... Use as needed    Metamucil Plus Calcium Caps (Psyllium-calcium) .Marland Kitchen... 1 by mouth once daily    Miralax Powd (Polyethylene glycol 3350) ..... One scoop in 8 ounces of juice once daily  Discussed  dietary fiber measures and increased water intake.   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   Problem # 4:  ABDOMINAL PAIN, LEFT UPPER QUADRANT (ICD-789.02) Assessment: Unchanged will check plain films for stool burden and obstruction Orders: T-Acute Abdomen (2 view w/ PA & Chest (16109UE)  Complete Medication List: 1)  Epsom Salt Gran (Magnesium sulfate (laxative)) .... Use as needed 2)  Metamucil Plus Calcium Caps (Psyllium-calcium) .Marland Kitchen.. 1 by mouth once daily 3)  Miralax Powd (Polyethylene glycol 3350) .... One scoop in 8 ounces of juice once daily  Patient Instructions: 1)  Please schedule a follow-up appointment in 2 months. 2)  Tobacco is very bad for your health and your loved ones! You Should stop smoking!. 3)  Stop Smoking Tips: Choose a Quit date. Cut down before the Quit date. decide what you will do as a substitute when you feel the urge to smoke(gum,toothpick,exercise). 4)  Check your Blood Pressure regularly. If it is above 140/90: you should make an appointment. Prescriptions: MIRALAX  POWD (POLYETHYLENE GLYCOL 3350) one scoop in 8 ounces of juice once daily  #30 days x 11   Entered and Authorized by:   Etta Grandchild MD   Signed by:   Etta Grandchild MD on 03/01/2010   Method used:   Electronically to        [facility] [phone]* (retail)       [address]       [place], [place]  [address]       Ph: [phone]       Fax: [phone]   RxID:   [id number]    Orders Added: 1)  T-Acute Abdomen (2 view w/ PA & Chest [74022TC] 2)  Est. Patient Level IV [72536]

## 2010-06-04 LAB — CBC
Hemoglobin: 14.4 g/dL (ref 13.0–17.0)
MCH: 27.4 pg (ref 26.0–34.0)
MCH: 28.1 pg (ref 26.0–34.0)
MCHC: 33.4 g/dL (ref 30.0–36.0)
MCV: 82.1 fL (ref 78.0–100.0)
MCV: 84.1 fL (ref 78.0–100.0)
Platelets: 251 10*3/uL (ref 150–400)
RBC: 5.25 MIL/uL (ref 4.22–5.81)
RBC: 5.34 MIL/uL (ref 4.22–5.81)

## 2010-06-04 LAB — COMPREHENSIVE METABOLIC PANEL
Albumin: 4.2 g/dL (ref 3.5–5.2)
BUN: 16 mg/dL (ref 6–23)
BUN: 20 mg/dL (ref 6–23)
CO2: 29 mEq/L (ref 19–32)
CO2: 26 mEq/L (ref 19–32)
Calcium: 9.6 mg/dL (ref 8.4–10.5)
Chloride: 109 mEq/L (ref 96–112)
Chloride: 112 mEq/L (ref 96–112)
Creatinine, Ser: 1.26 mg/dL (ref 0.4–1.5)
Creatinine, Ser: 1.42 mg/dL (ref 0.4–1.5)
GFR calc Af Amer: >60 mL/min (ref >60)
GFR calc non Af Amer: 58 mL/min — ABNORMAL LOW (ref >60)
GFR calc non Af Amer: 51 mL/min — ABNORMAL LOW (ref >60)
Glucose, Bld: 89 mg/dL (ref 70–99)
Total Bilirubin: 0.6 mg/dL (ref 0.3–1.2)
Total Bilirubin: 0.7 mg/dL (ref 0.3–1.2)

## 2010-06-04 LAB — URINALYSIS, ROUTINE W REFLEX MICROSCOPIC
Bilirubin Urine: NEGATIVE
Hgb urine dipstick: NEGATIVE
Specific Gravity, Urine: 1.02 (ref 1.005–1.030)
pH: 7.5 (ref 5.0–8.0)

## 2010-06-04 LAB — DIFFERENTIAL
Basophils Absolute: 0 10*3/uL (ref 0.0–0.1)
Basophils Relative: 0 % (ref 0–1)
Eosinophils Absolute: 0.2 10*3/uL (ref 0.0–0.7)
Lymphocytes Relative: 26 % (ref 12–46)
Lymphocytes Relative: 27 % (ref 12–46)
Lymphs Abs: 1.8 10*3/uL (ref 0.7–4.0)
Monocytes Absolute: 0.8 10*3/uL (ref 0.1–1.0)
Monocytes Relative: 10 % (ref 3–12)
Neutro Abs: 4.6 10*3/uL (ref 1.7–7.7)
Neutro Abs: 4.2 10*3/uL (ref 1.7–7.7)

## 2010-06-04 LAB — LIPASE, BLOOD: Lipase: 28 U/L (ref 11–59)

## 2010-08-03 NOTE — Consult Note (Signed)
VASCULAR SURGERY CONSULTATION   CORTAVIOUS, NIX  DOB:  14-Jan-1951                                       02/24/2010  EAVWU#:[id number]   I saw the patient in the office today in consultation concerning  possible celiac axis compression syndrome.  He was referred by Dr.  Yetta Barre.  This is a pleasant 60 year old gentleman who tells me that he  noted the gradual onset of left-sided abdominal pain in October.  He  states that the pain is fairly constant.  There are no aggravating  factors.  Specifically, he denies postprandial abdominal pain.  He says  the only alleviating factor is he has taken some pain pills in the past  that helped.  His only associated symptom has been some constipation,  which has been going on for several months.  He specifically denies any  weight loss.  He has had no nausea or vomiting, and he does not describe  any epigastric pain to me although his records do mention that he has  had epigastric pain.  I have asked him multiple times and he says that  the pain is always in the left side of his abdomen.   He underwent a CT scan on November 11, 2009, and this showed multiple  small low-density lesions in the liver, which may represent small cysts.  Further evaluation with MRI was suggested.  He subsequently had an MRI  on February 03, 2010, and this showed that the origin of the celiac axis  was approximately 50% narrowed over a short segment.  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  Inferior mesenteric artery was small but patent.   The patient states that his pain went away for several weeks and then he  was raking leaves in the yard and began having some left-sided abdominal  pain again.   PAST MEDICAL HISTORY:  Significant for hypertension, gastroesophageal  reflux disease.  He denies any history of diabetes,  hypercholesterolemia, history of previous myocardial infarction or  history of  congestive heart failure.   SOCIAL HISTORY:  He is single and only has 1 child.  He smokes a pack  per day of cigarettes and has been smoking since he was 60 years old.  He does not drink alcohol on a regular basis.   FAMILY HISTORY:  He is unaware of any history of premature  cardiovascular disease.   REVIEW OF SYSTEMS:  GENERAL:  He had no recent weight loss, weight gain  or problems with his appetite.  CARDIOVASCULAR:  He has had no chest pain, chest pressure, palpitations  or arrhythmias.  He does admit to some orthopnea.  He has had no  claudication, rest pain or nonhealing ulcers.  He has had no history of  stroke, TIAs or amaurosis fugax.  He has had no history of DVT or  phlebitis.  GI:  He has had some constipation.  He has had no melena, peptic ulcer  disease or history of a hiatal hernia.  NEUROLOGIC:  He has occasional dizziness.  PULMONARY:  He has had no productive cough, bronchitis, asthma or  wheezing.  GU:  He does have some urinary frequency.  MUSCULOSKELETAL:  He does have arthritis.  Hematologic, ENT, psychiatric, integumentary review of systems is  unremarkable and is documented on the  medical history form in his chart.   PHYSICAL EXAMINATION:  This is a pleasant 60 year old gentleman who  appears his stated age.  His blood pressure is 127/84, heart rate is 73,  saturation 98%.  HEENT:  Unremarkable.  Lungs are clear bilaterally to  auscultation without rales, rhonchi or wheezing.  On cardiovascular  exam, I do not detect a carotid bruit.  He has a regular rate and  rhythm.  He has palpable femoral, popliteal and pedal pulses  bilaterally.  He has no significant lower extremity swelling.  The  abdomen is soft and nontender with normal-pitched bowel sounds.  I do  not appreciate an abdominal bruit.  Musculoskeletal exam:  There is no  major deformities or cyanosis.  Neurologic:  He has no focal weakness or  paresthesias.  Skin:  There are no ulcers or  rashes.   I have reviewed his records sent from [facility].  He does  have a history of syphilis.  This is latent syphilis.  It looks like he  has previously been counseled on tobacco cessation.   I have also reviewed his CT scan.  This shows that the celiac axis is  patent.  I do not appreciate any evidence of significant stenosis on  this study.  The SMA is widely patent.  The IMA is small but patent.  I  have also reviewed his MRA including the arterial reconstructions, which  suggest a very mild narrowing of the proximal celiac axis but this does  not look like median arcuate ligament syndrome to me.  This usually  shows an abrupt kink in the celiac axis by compression of the median  arcuate ligament, and this is simply a mild narrowing in the proximal  celiac axis.  The SMA is widely patent and the IMA is patent also an  MRA.   Based on review of his CT scan and MRI, and I do not think he has  evidence of significant celiac axis compression syndrome.  In addition,  the history that I obtained is that this is mostly left-sided abdominal  pain and he has had no significant epigastric pain and no weight loss  so, again, I am not convinced that he has celiac axis compression  syndrome.   If he has persistent pain, especially epigastric pain, or develops  significant weight loss and no other source can be found, then certainly  I would be willing to proceed with a mesenteric arteriogram to further  evaluate this.  However, at this point I think it is very unlikely that  this is the issue.  He seems to have significant problems with  constipation and his pain could simply be related to this.  In addition,  he developed left-sided pain after raking leaves and he may simply have  some musculoskeletal pain.  Again, I would be happy to see him back if  his symptoms do not resolve ultimately.     Di Kindle. Edilia Bo, M.D.  Electronically Signed  CSD/MEDQ  D:  02/24/2010   T:  02/25/2010  Job:  [id number]   cc:    Primary Care Dr. Etta Grandchild

## 2014-10-24 ENCOUNTER — Encounter: Payer: Self-pay | Admitting: Gastroenterology

## 2016-09-12 ENCOUNTER — Emergency Department (HOSPITAL_COMMUNITY): Payer: Medicare HMO

## 2016-09-12 ENCOUNTER — Emergency Department (HOSPITAL_COMMUNITY)
Admission: EM | Admit: 2016-09-12 | Discharge: 2016-09-12 | Disposition: A | Discharge status: Home / Self Care | Payer: Medicare HMO | Attending: Emergency Medicine | Admitting: Emergency Medicine

## 2016-09-12 ENCOUNTER — Encounter (HOSPITAL_COMMUNITY): Payer: Self-pay | Admitting: *Deleted

## 2016-09-12 DIAGNOSIS — X509XXA Other and unspecified overexertion or strenuous movements or postures, initial encounter: Secondary | ICD-10-CM | POA: Diagnosis not present

## 2016-09-12 DIAGNOSIS — I10 Essential (primary) hypertension: Secondary | ICD-10-CM | POA: Insufficient documentation

## 2016-09-12 DIAGNOSIS — Y999 Unspecified external cause status: Secondary | ICD-10-CM | POA: Insufficient documentation

## 2016-09-12 DIAGNOSIS — M5412 Radiculopathy, cervical region: Secondary | ICD-10-CM | POA: Diagnosis not present

## 2016-09-12 DIAGNOSIS — S4992XA Unspecified injury of left shoulder and upper arm, initial encounter: Secondary | ICD-10-CM | POA: Diagnosis present

## 2016-09-12 DIAGNOSIS — M25512 Pain in left shoulder: Secondary | ICD-10-CM | POA: Diagnosis not present

## 2016-09-12 DIAGNOSIS — R69 Illness, unspecified: Secondary | ICD-10-CM | POA: Diagnosis not present

## 2016-09-12 DIAGNOSIS — F17200 Nicotine dependence, unspecified, uncomplicated: Secondary | ICD-10-CM | POA: Insufficient documentation

## 2016-09-12 DIAGNOSIS — Y9389 Activity, other specified: Secondary | ICD-10-CM | POA: Insufficient documentation

## 2016-09-12 DIAGNOSIS — R079 Chest pain, unspecified: Secondary | ICD-10-CM | POA: Diagnosis not present

## 2016-09-12 DIAGNOSIS — Z79899 Other long term (current) drug therapy: Secondary | ICD-10-CM | POA: Insufficient documentation

## 2016-09-12 DIAGNOSIS — S46912A Strain of unspecified muscle, fascia and tendon at shoulder and upper arm level, left arm, initial encounter: Secondary | ICD-10-CM | POA: Diagnosis not present

## 2016-09-12 DIAGNOSIS — M542 Cervicalgia: Secondary | ICD-10-CM | POA: Diagnosis not present

## 2016-09-12 DIAGNOSIS — Y929 Unspecified place or not applicable: Secondary | ICD-10-CM | POA: Insufficient documentation

## 2016-09-12 LAB — COMPREHENSIVE METABOLIC PANEL
ALBUMIN: 3.7 g/dL (ref 3.5–5.0)
ALT: 13 U/L — ABNORMAL LOW (ref 17–63)
ANION GAP: 6 (ref 5–15)
AST: 20 U/L (ref 15–41)
Alkaline Phosphatase: 57 U/L (ref 38–126)
BILIRUBIN TOTAL: 0.6 mg/dL (ref 0.3–1.2)
BUN: 10 mg/dL (ref 6–20)
CO2: 22 mmol/L (ref 22–32)
Calcium: 9.2 mg/dL (ref 8.9–10.3)
Chloride: 108 mmol/L (ref 101–111)
Creatinine, Ser: 1 mg/dL (ref 0.61–1.24)
GFR calc Af Amer: >60 mL/min (ref >60)
GFR calc non Af Amer: >60 mL/min (ref >60)
GLUCOSE: 95 mg/dL (ref 65–99)
POTASSIUM: 3.8 mmol/L (ref 3.5–5.1)
SODIUM: 136 mmol/L (ref 135–145)
TOTAL PROTEIN: 6.7 g/dL (ref 6.5–8.1)

## 2016-09-12 LAB — CBC WITH DIFFERENTIAL/PLATELET
BASOS ABS: 0 10*3/uL (ref 0.0–0.1)
BASOS PCT: 0 %
EOS ABS: 0.1 10*3/uL (ref 0.0–0.7)
Eosinophils Relative: 1 %
HEMATOCRIT: 40.7 % (ref 39.0–52.0)
HEMOGLOBIN: 13.7 g/dL (ref 13.0–17.0)
Lymphocytes Relative: 21 %
Lymphs Abs: 1.9 10*3/uL (ref 0.7–4.0)
MCH: 27.5 pg (ref 26.0–34.0)
MCHC: 33.7 g/dL (ref 30.0–36.0)
MCV: 81.7 fL (ref 78.0–100.0)
MONO ABS: 0.5 10*3/uL (ref 0.1–1.0)
Monocytes Relative: 6 %
NEUTROS ABS: 6.2 10*3/uL (ref 1.7–7.7)
NEUTROS PCT: 72 %
Platelets: 225 10*3/uL (ref 150–400)
RBC: 4.98 MIL/uL (ref 4.22–5.81)
RDW: 15.2 % (ref 11.5–15.5)
WBC: 8.7 10*3/uL (ref 4.0–10.5)

## 2016-09-12 LAB — I-STAT TROPONIN, ED
Troponin i, poc: 0 ng/mL (ref 0.00–0.08)
Troponin i, poc: 0 ng/mL (ref 0.00–0.08)

## 2016-09-12 MED ORDER — CYCLOBENZAPRINE HCL 10 MG PO TABS: 5.0000 mg | ORAL_TABLET | Freq: Three times a day (TID) | ORAL | 0 refills | Status: DC | PRN

## 2016-09-12 MED ORDER — HYDROCODONE-ACETAMINOPHEN 5-325 MG PO TABS: 1.0000 | ORAL_TABLET | Freq: Four times a day (QID) | ORAL | 0 refills | Status: DC | PRN

## 2016-09-12 MED ORDER — LISINOPRIL 10 MG PO TABS
10.0000 mg | ORAL_TABLET | Freq: Once | ORAL | Status: AC
  Administered 2016-09-12: 10 mg via ORAL
  Filled 2016-09-12: qty 1

## 2016-09-12 MED ORDER — HYDRALAZINE HCL 20 MG/ML IJ SOLN
5.0000 mg | Freq: Once | INTRAMUSCULAR | Status: AC
  Administered 2016-09-12: 5 mg via INTRAVENOUS
  Filled 2016-09-12: qty 1

## 2016-09-12 MED ORDER — LISINOPRIL 10 MG PO TABS: 10.0000 mg | ORAL_TABLET | Freq: Every day | ORAL | 0 refills | Status: DC

## 2016-09-12 MED ORDER — IBUPROFEN 600 MG PO TABS: 600.0000 mg | ORAL_TABLET | Freq: Four times a day (QID) | ORAL | 0 refills | Status: DC | PRN

## 2016-09-12 MED ORDER — MORPHINE SULFATE (PF) 4 MG/ML IV SOLN
4.0000 mg | Freq: Once | INTRAVENOUS | Status: AC
  Administered 2016-09-12: 4 mg via INTRAVENOUS
  Filled 2016-09-12: qty 1

## 2016-09-12 NOTE — ED Provider Notes (Signed)
[facility]-EMERGENCY DEPT Provider Note   CSN: [id number] Arrival date & time: 09/12/16  0908     History   Chief Complaint Chief Complaint  Patient presents with  . Arm Pain  . Shoulder Pain    HPI Jesse Williams is a 66 y.o. male hx of HTN, Hasn't seen a doctor for years, here presenting with left arm pain and weakness. Patient states that he is right-hand-dominant and was painting with his right hand about 3 days ago. After he painted that today he noticed some left shoulder pain and numbness in the left hand. Denies overusing the left arm or injury to the left shoulder. Denies any chest pain or back pain at that time. Patient states that the left shoulder pain has become progressively worse. Denies any trouble speaking or weakness or numbness in the legs. Patient was noted to be hypertensive in triage but he is not currently on any antihypertensives.   The history is provided by the patient.    History reviewed. No pertinent past medical history.  Patient Active Problem List   Diagnosis Date Noted  . CONSTIPATION 03/01/2010  . 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 SYPHILIS, LATENT 01/28/2010  . TOBACCO USE 01/28/2010  . HYPERTENSION 01/28/2010  . GERD 01/28/2010  . 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 IBS 11/18/2009  . LOSS OF WEIGHT 11/18/2009  . NAUSEA ALONE 11/18/2009  . ABDOMINAL PAIN, LEFT UPPER QUADRANT 11/18/2009  . ABNORMAL FINDINGS GI TRACT 11/18/2009    History reviewed. No pertinent surgical history.     Home Medications    Prior to Admission medications   Not on File    Family History No family history on file.  Social History Social History  Substance Use Topics  . Smoking status: Current Every Day Smoker  . Smokeless tobacco: Never Used  . Alcohol use No     Comment: social use     Allergies   Patient has no known allergies.   Review of Systems Review of Systems  Musculoskeletal:       L arm pain, L  shoulder pain   All other systems reviewed and are negative.    Physical Exam Updated Vital Signs BP (!) 186/109 (BP Location: Right Arm)   Pulse 62   Temp 97.7 F (36.5 C) (Oral)   Resp 18   SpO2 97%   Physical Exam  Constitutional: He is oriented to person, place, and time.  Slightly uncomfortable   HENT:  Head: Normocephalic.  Mouth/Throat: Oropharynx is clear and moist.  Eyes: EOM are normal. Pupils are equal, round, and reactive to light.  Neck:  No midline cervical tenderness, mild L paracervical tenderness   Cardiovascular: Normal rate, regular rhythm and normal heart sounds.   Pulmonary/Chest: Effort normal and breath sounds normal. No respiratory distress. He has no wheezes.  Abdominal: Soft. Bowel sounds are normal. He exhibits no distension. There is no tenderness.  Musculoskeletal:  Dec ROM L shoulder, no obvious deformity. No tenderness of the humerus or elbow. Dec L hand grasp from pain. 2+ pulses.   Neurological: He is alert and oriented to person, place, and time. No cranial nerve deficit.  CN 2-12 intact. Nl strength bilateral lower extremities. Limited exam L arm due to pain   Skin: Skin is warm.  Psychiatric: He has a normal mood and affect.  Nursing note and vitals reviewed.    ED Treatments / Results  Labs (all labs ordered are listed, but only  abnormal results are displayed) Labs Reviewed  CBC WITH DIFFERENTIAL/PLATELET  COMPREHENSIVE METABOLIC PANEL  I-STAT TROPOININ, ED    EKG  EKG Interpretation  Date/Time:  Monday September 12 2016 10:14:06 EDT Ventricular Rate:  62 PR Interval:    QRS Duration: 109 QT Interval:  411 QTC Calculation: 418 R Axis:   -3 Text Interpretation:  Sinus rhythm Probable left ventricular hypertrophy ST elevation, consider anterior injury No previous ECGs available Confirmed by Richardean CanalYao, Keatin Benham H [phone]([id number]) on 09/12/2016 10:20:46 AM       Radiology No results found.  Procedures Procedures (including critical care  time)  Medications Ordered in ED Medications  morphine 4 MG/ML injection 4 mg (not administered)  hydrALAZINE (APRESOLINE) injection 5 mg (not administered)     Initial Impression / Assessment and Plan / ED Course  I have reviewed the triage vital signs and the nursing notes.  Pertinent labs & imaging results that were available during my care of the patient were reviewed by me and considered in my medical decision making (see chart for details).     Jesse Williams is a 66 y.o. male here with L shoulder pain, hypertensive. Likely hypertensive urgency vs shoulder strain vs cervical radiculopathy. Will get labs, xrays of shoulder and neck and chest. Will give pain meds, antihypertensives.     2:20 PM Pain improved with pain meds. Xray showed no fractures, some degenerative changes in the neck likely causing cervical radiculopathy. Will give sling for comfort. BP improved to 150-160s after lisinopril. Will start lisinopril 10 mg daily. He saw Dr. Fayrene FearingJames before and can get back to see him outpatient. Will refer to ortho as well for shoulder pain/ cervical radiculopathy. I doubt dissection. Trop neg x 2.   Final Clinical Impressions(s) / ED Diagnoses   Final diagnoses:  None    New Prescriptions New Prescriptions   No medications on file     Charlynne PanderYao, Brayah Urquilla Hsienta, MD 09/12/16 1421

## 2016-09-12 NOTE — ED Notes (Signed)
Sister number Okey RegalCarol [phone]

## 2016-09-12 NOTE — ED Notes (Signed)
Patient presents to ed c/o left shoulder pain onset Sat. Denies injury. States it hurts from his shoulder to mid humerus.

## 2016-09-12 NOTE — ED Triage Notes (Signed)
To ED for eval of left shoulder and arm pain. Pt unable to lift arm. No injury noted.Pt made self immobilizer with electrical tape to hold arm up. States he was painting a fence but using his right arm. Left grips weak. States he was walking in yard this am and pain was so bad he felt like he was going to pass out - got scared so came to ED. Pulses palpable. Sensation intact.

## 2016-09-12 NOTE — Discharge Instructions (Signed)
Wear sling for comfort.   Take tylenol or motrin for pain.   Take flexeril for muscle spasms.   Take vicodin for severe pain. Do not drive with it.   You blood pressure is high. Take lisinopril 10 mg daily .  See orthopedic doctor in a week for follow up regarding your shoulder and neck   See your primary care doctor (Dr. Jonny RuizJohn) in a week to recheck your blood pressure  Return to ER if you have severe chest pain, trouble breathing, shoulder pain, arm pain, numbness, weakness.

## 2017-02-27 ENCOUNTER — Ambulatory Visit (HOSPITAL_COMMUNITY)
Admission: EM | Admit: 2017-02-27 | Discharge: 2017-02-27 | Disposition: A | Discharge status: Home / Self Care | Payer: Medicare HMO | Attending: Emergency Medicine | Admitting: Emergency Medicine

## 2017-02-27 ENCOUNTER — Encounter (HOSPITAL_COMMUNITY): Payer: Self-pay | Admitting: Emergency Medicine

## 2017-02-27 DIAGNOSIS — I10 Essential (primary) hypertension: Secondary | ICD-10-CM | POA: Diagnosis not present

## 2017-02-27 MED ORDER — LISINOPRIL 10 MG PO TABS: 10.0000 mg | ORAL_TABLET | Freq: Every day | ORAL | 0 refills | Status: DC

## 2017-02-27 NOTE — ED Provider Notes (Signed)
[facility]    CSN: [id number] Arrival date & time: 02/27/17  1637     History   Chief Complaint Chief Complaint  Patient presents with  . Hypertension    HPI Jesse Williams is a 66 y.o. male presenting for high blood pressure medication refill. He was started on lisinopril 10 mg daily back in June. He has not been on medication for a few months as he has not set up a PCP yet. He states today he came in because he started to feel a little strain in his neck when he moves. This is the same symptom he had when he was started on blood pressure medication previously. He denies headache, vision changes, chest pain, decreased urine. Endorses increased frequency. Denies shortness of breath.  HPI  History reviewed. No pertinent past medical history.  Patient Active Problem List   Diagnosis Date Noted  . CONSTIPATION 03/01/2010  . 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 SYPHILIS, LATENT 01/28/2010  . TOBACCO USE 01/28/2010  . HYPERTENSION 01/28/2010  . GERD 01/28/2010  . 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 IBS 11/18/2009  . LOSS OF WEIGHT 11/18/2009  . NAUSEA ALONE 11/18/2009  . ABDOMINAL PAIN, LEFT UPPER QUADRANT 11/18/2009  . ABNORMAL FINDINGS GI TRACT 11/18/2009    History reviewed. No pertinent surgical history.     Home Medications    Prior to Admission medications   Medication Sig Start Date End Date Taking? Authorizing Provider  acetaminophen (TYLENOL) 325 MG tablet Take 650 mg by mouth every 6 (six) hours as needed for mild pain.    [provider]  cyclobenzaprine (FLEXERIL) 10 MG tablet Take 0.5 tablets (5 mg total) by mouth 3 (three) times daily as needed for muscle spasms. 09/12/16   Charlynne PanderYao, David Hsienta, MD  HYDROcodone-acetaminophen (NORCO/VICODIN) 5-325 MG tablet Take 1 tablet by mouth every 6 (six) hours as needed. 09/12/16   Charlynne PanderYao, David Hsienta, MD  ibuprofen (ADVIL,MOTRIN) 600 MG tablet Take 1 tablet (600 mg  total) by mouth every 6 (six) hours as needed. 09/12/16   Charlynne PanderYao, David Hsienta, MD  lisinopril (PRINIVIL,ZESTRIL) 10 MG tablet Take 1 tablet (10 mg total) by mouth daily. 02/27/17 03/29/17  Iden Stripling, Junius CreamerHallie C, PA-C    Family History History reviewed. No pertinent family history.  Social History Social History   Tobacco Use  . Smoking status: Current Every Day Smoker  . Smokeless tobacco: Never Used  Substance Use Topics  . Alcohol use: No    Comment: social use  . Drug use: Not on file     Allergies   Patient has no known allergies.   Review of Systems Review of Systems  Constitutional: Negative for chills and fever.  HENT: Negative for congestion, ear pain and sore throat.   Eyes: Negative for pain and visual disturbance.  Respiratory: Negative for cough and shortness of breath.   Cardiovascular: Negative for chest pain and palpitations.  Gastrointestinal: Negative for abdominal pain, nausea and vomiting.  Genitourinary: Negative for dysuria and hematuria.  Musculoskeletal: Positive for neck pain. Negative for arthralgias and back pain.  Skin: Negative for color change and rash.  Neurological: Negative for syncope, light-headedness, numbness and headaches.  All other systems reviewed and are negative.    Physical Exam Triage Vital Signs ED Triage Vitals  Enc Vitals Group     BP 02/27/17 1707 (!) 178/101     Pulse Rate 02/27/17 1707 76     Resp 02/27/17 1707 20  Temp 02/27/17 1707 98.4 F (36.9 C)     Temp Source 02/27/17 1707 Oral     SpO2 02/27/17 1707 100 %     Weight --      Height --      Head Circumference --      Peak Flow --      Pain Score 02/27/17 1705 10     Pain Loc --      Pain Edu? --      Excl. in GC? --    No data found.  Updated Vital Signs BP (!) 178/101 (BP Location: Left Arm)   Pulse 76   Temp 98.4 F (36.9 C) (Oral)   Resp 20   SpO2 100%    Physical Exam  Constitutional: He is oriented to person, place, and time. He appears  well-developed and well-nourished.  HENT:  Head: Normocephalic and atraumatic.  Eyes: Conjunctivae are normal.  Neck: Neck supple.  Cardiovascular: Normal rate and regular rhythm.  No murmur heard. Pulmonary/Chest: Effort normal and breath sounds normal. No respiratory distress.  Abdominal: Soft. There is no tenderness.  Musculoskeletal: He exhibits no edema.  Neurological: He is alert and oriented to person, place, and time.  Skin: Skin is warm and dry.  Psychiatric: He has a normal mood and affect.  Nursing note and vitals reviewed.    UC Treatments / Results  Labs (all labs ordered are listed, but only abnormal results are displayed) Labs Reviewed - No data to display  EKG  EKG Interpretation None       Radiology No results found.  Procedures Procedures (including critical care time)  Medications Ordered in UC Medications - No data to display   Initial Impression / Assessment and Plan / UC Course  I have reviewed the triage vital signs and the nursing notes.  Pertinent labs & imaging results that were available during my care of the patient were reviewed by me and considered in my medical decision making (see chart for details).     No evidence of hypertensive urgency, ACS. Patient given refill of lisinopril for 30 days. Provided info for [facility], advised importance of having a PCP for continued care/refills. He seemed understanding and plans to try and establish care.   Discussed return precautions to include chest pain, shortness of breath, vision changes, back pain, headache, decreased urine, weakness, slurred speech, one side paralysis. Patient verbalized understanding and is agreeable with plan.  Final Clinical Impressions(s) / UC Diagnoses   Final diagnoses:  Essential hypertension    ED Discharge Orders        Ordered    lisinopril (PRINIVIL,ZESTRIL) 10 MG tablet  Daily,   Status:  Discontinued     02/27/17 1738    lisinopril  (PRINIVIL,ZESTRIL) 10 MG tablet  Daily     02/27/17 1744       Cont[place] Controlled Substance Registry consulted? Not Applicable   Lew DawesWieters, Parrish Bonn C, [place]-C 02/27/17 1753

## 2017-02-27 NOTE — ED Triage Notes (Signed)
PT C/O: for HTN and needing refill on meds has been out of them x5 months  ONSET: 4 days  SX ALSO INCLUDE: HA  DENIES: SOB, CP, diaphoresis  TAKING MEDS: none   A&O x4... NAD... Ambulatory

## 2017-02-27 NOTE — Discharge Instructions (Signed)
Lisinopril sent to pharmacy. Take 10 mg daily.  Monitor blood pressure at home, pharmacy, gym, [employer]. Monitor so you know how well your pressure is controlled.  Set up a primary care doctor for refills. Dr Fayrene FearingJames or with [facility]. Contact info below.   Please return if experiencing chest pain, headache, vision changes, shortness of breath, severe back pain, decreased urine.

## 2017-03-01 ENCOUNTER — Other Ambulatory Visit: Payer: Self-pay

## 2017-03-01 ENCOUNTER — Emergency Department (HOSPITAL_COMMUNITY): Payer: Medicare HMO

## 2017-03-01 ENCOUNTER — Emergency Department (HOSPITAL_COMMUNITY)
Admission: EM | Admit: 2017-03-01 | Discharge: 2017-03-01 | Disposition: A | Discharge status: Home / Self Care | Payer: Medicare HMO | Attending: Emergency Medicine | Admitting: Emergency Medicine

## 2017-03-01 ENCOUNTER — Encounter (HOSPITAL_COMMUNITY): Payer: Self-pay

## 2017-03-01 DIAGNOSIS — I10 Essential (primary) hypertension: Secondary | ICD-10-CM | POA: Diagnosis not present

## 2017-03-01 DIAGNOSIS — Z79899 Other long term (current) drug therapy: Secondary | ICD-10-CM | POA: Diagnosis not present

## 2017-03-01 DIAGNOSIS — F17200 Nicotine dependence, unspecified, uncomplicated: Secondary | ICD-10-CM | POA: Diagnosis not present

## 2017-03-01 DIAGNOSIS — R100 Acute abdomen: Secondary | ICD-10-CM | POA: Diagnosis not present

## 2017-03-01 DIAGNOSIS — M7918 Myalgia, other site: Secondary | ICD-10-CM | POA: Insufficient documentation

## 2017-03-01 DIAGNOSIS — M791 Myalgia, unspecified site: Secondary | ICD-10-CM | POA: Diagnosis not present

## 2017-03-01 DIAGNOSIS — M549 Dorsalgia, unspecified: Secondary | ICD-10-CM | POA: Diagnosis not present

## 2017-03-01 DIAGNOSIS — R69 Illness, unspecified: Secondary | ICD-10-CM | POA: Diagnosis not present

## 2017-03-01 HISTORY — DX: Essential (primary) hypertension: I10

## 2017-03-01 LAB — BASIC METABOLIC PANEL
ANION GAP: 6 (ref 5–15)
BUN: 11 mg/dL (ref 6–20)
CALCIUM: 9.2 mg/dL (ref 8.9–10.3)
CO2: 26 mmol/L (ref 22–32)
Chloride: 104 mmol/L (ref 101–111)
Creatinine, Ser: 1.05 mg/dL (ref 0.61–1.24)
GFR calc Af Amer: >60 mL/min (ref >60)
GFR calc non Af Amer: >60 mL/min (ref >60)
GLUCOSE: 96 mg/dL (ref 65–99)
Potassium: 3.9 mmol/L (ref 3.5–5.1)
SODIUM: 136 mmol/L (ref 135–145)

## 2017-03-01 LAB — URINALYSIS, ROUTINE W REFLEX MICROSCOPIC
Bilirubin Urine: NEGATIVE
Glucose, UA: NEGATIVE mg/dL
HGB URINE DIPSTICK: NEGATIVE
Ketones, ur: NEGATIVE mg/dL
Leukocytes, UA: NEGATIVE
Nitrite: NEGATIVE
PROTEIN: NEGATIVE mg/dL
Specific Gravity, Urine: 1.016 (ref 1.005–1.030)
pH: 6 (ref 5.0–8.0)

## 2017-03-01 NOTE — ED Provider Notes (Signed)
[facility] EMERGENCY DEPARTMENT Provider Note   CSN: [id number] Arrival date & time: 03/01/17  1218     History   Chief Complaint Chief Complaint  Patient presents with  . Generalized Body Aches  . Hypertension    HPI Jesse Williams is a 66 y.o. male.   Patient with a history of HTN presents with generalized body aches, most notable in the torso and described and "crampy". Symptoms started 3 days ago. No fever, cough, CP, or SOB. No nausea or vomiting. He reports urinary frequency without dysuria or hematuria, ongoing for the past 2 weeks. He is also concerned about his blood pressure. He had been out of his medications until 2 days ago when seen at Urgent Care, and has been compliant since then. He took his pressure today and it was elevated, causing concern. No fever, chills.   The history is provided by the patient. No language interpreter was used.  Hypertension  Pertinent negatives include no chest pain and no shortness of breath.    Past Medical History:  Diagnosis Date  . Hypertension     Patient Active Problem List   Diagnosis Date Noted  . CONSTIPATION 03/01/2010  . 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 SYPHILIS, LATENT 01/28/2010  . TOBACCO USE 01/28/2010  . HYPERTENSION 01/28/2010  . GERD 01/28/2010  . 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 IBS 11/18/2009  . LOSS OF WEIGHT 11/18/2009  . NAUSEA ALONE 11/18/2009  . ABDOMINAL PAIN, LEFT UPPER QUADRANT 11/18/2009  . ABNORMAL FINDINGS GI TRACT 11/18/2009    History reviewed. No pertinent surgical history.     Home Medications    Prior to Admission medications   Medication Sig Start Date End Date Taking? Authorizing Provider  ibuprofen (ADVIL,MOTRIN) 200 MG tablet Take 200-400 mg by mouth every 6 (six) hours as needed (for pain and general malaise).    Yes [provider]  lisinopril (PRINIVIL,ZESTRIL) 10 MG tablet Take 1 tablet (10 mg total) by  mouth daily. 02/27/17 03/29/17 Yes Wieters, Hallie C, PA-C  cyclobenzaprine (FLEXERIL) 10 MG tablet Take 0.5 tablets (5 mg total) by mouth 3 (three) times daily as needed for muscle spasms. Patient not taking: Reported on 03/01/2017 09/12/16   Charlynne Pander, MD  HYDROcodone-acetaminophen (NORCO/VICODIN) 5-325 MG tablet Take 1 tablet by mouth every 6 (six) hours as needed. Patient not taking: Reported on 03/01/2017 09/12/16   Charlynne Pander, MD  ibuprofen (ADVIL,MOTRIN) 600 MG tablet Take 1 tablet (600 mg total) by mouth every 6 (six) hours as needed. Patient not taking: Reported on 03/01/2017 09/12/16   Charlynne Pander, MD    Family History No family history on file.  Social History Social History   Tobacco Use  . Smoking status: Current Every Day Smoker  . Smokeless tobacco: Never Used  Substance Use Topics  . Alcohol use: No    Comment: social use  . Drug use: Not on file     Allergies   Patient has no known allergies.   Review of Systems Review of Systems  Constitutional: Negative for chills and fever.  HENT: Negative.   Respiratory: Negative.  Negative for shortness of breath.   Cardiovascular: Negative.  Negative for chest pain.  Gastrointestinal: Negative.  Negative for nausea and vomiting.  Genitourinary: Positive for frequency.  Musculoskeletal: Positive for myalgias.  Skin: Negative.   Neurological: Negative.  Negative for weakness.     Physical Exam Updated Vital Signs BP Marland Kitchen)  176/110 (BP Location: Right Arm)   Pulse 96   Temp 99.5 F (37.5 C) (Oral)   Resp 18   SpO2 99%   Physical Exam  Constitutional: He is oriented to person, place, and time. He appears well-developed and well-nourished.  HENT:  Head: Normocephalic.  Neck: Normal range of motion. Neck supple.  Cardiovascular: Normal rate and regular rhythm.  No murmur heard. Pulmonary/Chest: Effort normal. He has no wheezes. He has rales (right lower lobe).  Abdominal: Soft. Bowel sounds  are normal. There is no tenderness. There is no rebound and no guarding.  Musculoskeletal: Normal range of motion. He exhibits no edema.  Neurological: He is alert and oriented to person, place, and time.  Skin: Skin is warm and dry. No rash noted.  Psychiatric: He has a normal mood and affect.     ED Treatments / Results  Labs (all labs ordered are listed, but only abnormal results are displayed) Labs Reviewed - No data to display Results for orders placed or performed during the hospital encounter of 03/01/17  Basic metabolic panel  Result Value Ref Range   Sodium 136 135 - 145 mmol/L   Potassium 3.9 3.5 - 5.1 mmol/L   Chloride 104 101 - 111 mmol/L   CO2 26 22 - 32 mmol/L   Glucose, Bld 96 65 - 99 mg/dL   BUN 11 6 - 20 mg/dL   Creatinine, Ser 0.981.05 0.61 - 1.24 mg/dL   Calcium 9.2 8.9 - 11.910.3 mg/dL   GFR calc non Af Amer >60 >60 mL/min   GFR calc Af Amer >60 >60 mL/min   Anion gap 6 5 - 15  Urinalysis, Routine w reflex microscopic  Result Value Ref Range   Color, Urine YELLOW YELLOW   APPearance CLEAR CLEAR   Specific Gravity, Urine 1.016 1.005 - 1.030   pH 6.0 5.0 - 8.0   Glucose, UA NEGATIVE NEGATIVE mg/dL   Hgb urine dipstick NEGATIVE NEGATIVE   Bilirubin Urine NEGATIVE NEGATIVE   Ketones, ur NEGATIVE NEGATIVE mg/dL   Protein, ur NEGATIVE NEGATIVE mg/dL   Nitrite NEGATIVE NEGATIVE   Leukocytes, UA NEGATIVE NEGATIVE    EKG  EKG Interpretation None       Radiology No results found. Dg Chest 2 View  Result Date: 03/01/2017 CLINICAL DATA:  Body aches.  High blood pressure. EXAM: CHEST  2 VIEW COMPARISON:  09/12/2016 FINDINGS: Cardiomediastinal silhouette is normal. Mediastinal contours appear intact. Calcific atherosclerotic disease and tortuosity of the aorta. There is no evidence of focal airspace consolidation, pleural effusion or pneumothorax. Stable compression deformity of 1 of the upper thoracic vertebral bodies, when compared to 09/12/2016. Soft tissues are  grossly normal. IMPRESSION: Calcific atherosclerotic disease of the aorta. Age-indeterminate compression fracture of 1 of the upper thoracic vertebral bodies. No evidence of focal airspace consolidation. Electronically Signed   By: Ted Mcalpineobrinka  Dimitrova M.D.   On: 03/01/2017 21:16    Procedures Procedures (including critical care time)  Medications Ordered in ED Medications - No data to display   Initial Impression / Assessment and Plan / ED Course  I have reviewed the triage vital signs and the nursing notes.  Pertinent labs & imaging results that were available during my care of the patient were reviewed by me and considered in my medical decision making (see chart for details).     Patient with multiple complaints. .  Exam unremarkable. He is very well appearing. Blood pressure is elevated; normal renal function, no CP/SOB. Encouraged to continue his  Blood pressure medications.   Urine negative. He is felt appropriate for discharge home. He will follow up with PCP for recheck of BP.  Final Clinical Impressions(s) / ED Diagnoses   Final diagnoses:  None   1. Myalgias 2. HTN  ED Discharge Orders    None       Danne HarborUpstill, Jaylanni Eltringham, PA-C 03/01/17 2232    Raeford RazorKohut, Stephen, MD 03/07/17 [phone]

## 2017-03-01 NOTE — Discharge Instructions (Signed)
Follow up with your doctor for further management of your blood pressure. Return here as needed for worsening symptoms or new concerns.

## 2017-03-01 NOTE — ED Triage Notes (Addendum)
Pt arrives via EMS with complaints of neck, back, and abdominal pain x 3 days. PT reports body is achy and tender to the touch. [facility] being out of all medications x 5 months and was placed back on bp medications. Pt remains hypertensive at this time.   180/100 Hr 75 spo2 98% RA rr 16

## 2017-03-17 ENCOUNTER — Encounter: Payer: Self-pay | Admitting: Physician Assistant

## 2017-03-17 ENCOUNTER — Ambulatory Visit: Payer: Medicare HMO | Admitting: Internal Medicine

## 2017-03-17 ENCOUNTER — Ambulatory Visit (INDEPENDENT_AMBULATORY_CARE_PROVIDER_SITE_OTHER): Payer: Medicare HMO | Admitting: Physician Assistant

## 2017-03-17 VITALS — BP 148/88 | HR 84 | Temp 98.2°F | Resp 16 | Ht 71.0 in | Wt 172.0 lb

## 2017-03-17 DIAGNOSIS — R109 Unspecified abdominal pain: Secondary | ICD-10-CM | POA: Diagnosis not present

## 2017-03-17 DIAGNOSIS — M47812 Spondylosis without myelopathy or radiculopathy, cervical region: Secondary | ICD-10-CM

## 2017-03-17 DIAGNOSIS — K5900 Constipation, unspecified: Secondary | ICD-10-CM | POA: Diagnosis not present

## 2017-03-17 DIAGNOSIS — S161XXA Strain of muscle, fascia and tendon at neck level, initial encounter: Secondary | ICD-10-CM

## 2017-03-17 LAB — POCT URINALYSIS DIP (MANUAL ENTRY)
Bilirubin, UA: NEGATIVE
Blood, UA: NEGATIVE
Glucose, UA: NEGATIVE mg/dL
Ketones, POC UA: NEGATIVE mg/dL
Leukocytes, UA: NEGATIVE
Nitrite, UA: NEGATIVE
Protein Ur, POC: NEGATIVE mg/dL
Spec Grav, UA: 1.02 (ref 1.010–1.025)
Urobilinogen, UA: 0.2 E.U./dL
pH, UA: 5.5 (ref 5.0–8.0)

## 2017-03-17 MED ORDER — CYCLOBENZAPRINE HCL 10 MG PO TABS: 5.0000 mg | ORAL_TABLET | Freq: Three times a day (TID) | ORAL | 0 refills | Status: DC | PRN

## 2017-03-17 MED ORDER — POLYETHYLENE GLYCOL 3350 17 GM/SCOOP PO POWD
17.0000 g | Freq: Two times a day (BID) | ORAL | 1 refills | Status: DC | PRN
Start: 2017-03-17 — End: 2024-01-30

## 2017-03-17 MED ORDER — HYDROCODONE-ACETAMINOPHEN 5-325 MG PO TABS: 1.0000 | ORAL_TABLET | Freq: Four times a day (QID) | ORAL | 0 refills | Status: DC | PRN

## 2017-03-17 NOTE — Progress Notes (Signed)
[facility] [address], [place] [phone]  Date:  03/17/2017   Name:  Jesse Williams   DOB:  [birth year]   MRN:  [id number]  PCP:  Patient, No Pcp Per    History of Present Illness:  Jesse Williams is a [age] y.o. male patient who presents to PCP with  Chief Complaint  Patient presents with  . Neck Pain    left side, pt states he has pain on the left side.   . Medication Refill    fexeril and norco, pt was on meds before for pain     He has a pain left sided pain and left sided neck pain.  He will have pain in his neck and stiffness.  He has no paresthesia at that time.  Denies trauma, or change in sleep position or location.  No fever or dizziness. 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   He has had a left sided abdominal pain.  He is having a bowel movement once or twice per day.  He took one laxative this morning, without much of change of BM. He has no hematuria, anuria, dysuria, or frequency.  No black or bloody stool   Patient Active Problem List   Diagnosis Date Noted  . CONSTIPATION 03/01/2010  . 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 SYPHILIS, LATENT 01/28/2010  . TOBACCO USE 01/28/2010  . HYPERTENSION 01/28/2010  . GERD 01/28/2010  . 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 IBS 11/18/2009  . LOSS OF WEIGHT 11/18/2009  . NAUSEA ALONE 11/18/2009  . ABDOMINAL PAIN, LEFT UPPER QUADRANT 11/18/2009  . ABNORMAL FINDINGS GI TRACT 11/18/2009    Past Medical History:  Diagnosis Date  . Hypertension     No past surgical history on file.  Social History   Tobacco Use  . Smoking status: Current Every Day Smoker  . Smokeless tobacco: Never Used  Substance Use Topics  . Alcohol use: No    Comment: social use  . Drug use: Not on file    No family history on file.  No Known Allergies  Medication list has been reviewed and updated.  Current Outpatient  Medications on File Prior to Visit  Medication Sig Dispense Refill  . lisinopril (PRINIVIL,ZESTRIL) 10 MG tablet Take 1 tablet (10 mg total) by mouth daily. 30 tablet 0  . cyclobenzaprine (FLEXERIL) 10 MG tablet Take 0.5 tablets (5 mg total) by mouth 3 (three) times daily as needed for muscle spasms. (Patient not taking: Reported on 03/01/2017) 10 tablet 0  . HYDROcodone-acetaminophen (NORCO/VICODIN) 5-325 MG tablet Take 1 tablet by mouth every 6 (six) hours as needed. (Patient not taking: Reported on 03/01/2017) 8 tablet 0  . ibuprofen (ADVIL,MOTRIN) 200 MG tablet Take 200-400 mg by mouth every 6 (six) hours as needed (for pain and general malaise).     Marland Kitchen. ibuprofen (ADVIL,MOTRIN) 600 MG tablet Take 1 tablet (600 mg total) by mouth every 6 (six) hours as needed. (Patient not taking: Reported on 03/01/2017) 30 tablet 0   No current facility-administered medications on file prior to visit.     ROS ROS otherwise unremarkable unless listed above.  Physical Examination: BP (!) 148/88   Pulse 84   Temp 98.2 F (36.8 C) (Oral)   Resp 16   Ht 5\' 11"  (1.803 m)   Wt 172 lb (78 kg)   SpO2 97%  BMI 23.99 kg/m  Ideal Body Weight: Weight in (lb) to have BMI = 25: 178.9  Physical Exam  Constitutional: He is oriented to person, place, and time. He appears well-developed and well-nourished. No distress.  HENT:  Head: Normocephalic and atraumatic.  Eyes: Conjunctivae and EOM are normal. Pupils are equal, round, and reactive to light.  Neck: Spinous process tenderness and muscular tenderness (at the left side of posterior neck and along this area of the trapezius more anteriorly) present. Decreased range of motion (30 degrees with left horizontal rotation, and left deviation) present. No edema and no erythema present. No thyromegaly present.  Cardiovascular: Normal rate.  Pulmonary/Chest: Effort normal. No respiratory distress.  Neurological: He is alert and oriented to person, place, and time.   Skin: Skin is warm and dry. He is not diaphoretic.  Psychiatric: He has a normal mood and affect. His behavior is normal.    FINDINGS: Seven cervical segments are well visualized. Vertebral body height is well maintained. Multilevel osteophytic changes are noted from C3-T1. No anterolisthesis is noted. Mild neural foraminal narrowing is noted at multiple levels. No prevertebral soft tissue changes are seen. Multilevel facet hypertrophic changes are seen.  IMPRESSION: Multilevel degenerative change without acute abnormality.   Electronically Signed Assessment and Plan: Jesse Williams is a 66 y.o. male who is here today for cc of  Chief Complaint  Patient presents with  . Neck Pain    left side, pt states he has pain on the left side.   . Medication Refill    fexeril and norco, pt was on meds before for pain  discussed using the norco sparingly at this time, while he gets his bowel movements under control.   Twice per day of miralax for no more than 1 week, then moving into once daily.   Advised hydration and fiber diet.  He voiced understanding. Osteoarthritis of cervical spine, unspecified spinal osteoarthritis complication status - Plan: cyclobenzaprine (FLEXERIL) 10 MG tablet, HYDROcodone-acetaminophen (NORCO/VICODIN) 5-325 MG tablet  Left flank pain - Plan: POCT urinalysis dipstick  Strain of neck muscle, initial encounter - Plan: cyclobenzaprine (FLEXERIL) 10 MG tablet, HYDROcodone-acetaminophen (NORCO/VICODIN) 5-325 MG tablet  Constipation, unspecified constipation type - Plan: polyethylene glycol powder (GLYCOLAX/MIRALAX) powder  Trena PlattStephanie Maimouna Rondeau, PA-C [facility] 12/30/20184:34 PM

## 2017-03-17 NOTE — Patient Instructions (Addendum)
Please make sure you are hydrating well with 64 oz of water.  I would like you to use the miralax twice per day for no more than 1 week.  You can then take it once differently. Do not operate heavy machinery when using the flexeril.  This can be sedating.     Constipation, Adult Constipation is when a person:  Poops (has a bowel movement) fewer times in a week than normal.  Has a hard time pooping.  Has poop that is dry, hard, or bigger than normal.  Follow these instructions at home: Eating and drinking   Eat foods that have a lot of fiber, such as: ? Fresh fruits and vegetables. ? Whole grains. ? Beans.  Eat less of foods that are high in fat, low in fiber, or overly processed, such as: ? [place] fries. ? Hamburgers. ? Cookies. ? Candy. ? Soda.  Drink enough fluid to keep your pee (urine) clear or pale yellow. General instructions  Exercise regularly or as told by your doctor.  Go to the restroom when you feel like you need to poop. Do not hold it in.  Take over-the-counter and prescription medicines only as told by your doctor. These include any fiber supplements.  Do pelvic floor retraining exercises, such as: ? Doing deep breathing while relaxing your lower belly (abdomen). ? Relaxing your pelvic floor while pooping.  Watch your condition for any changes.  Keep all follow-up visits as told by your doctor. This is important. Contact a doctor if:  You have pain that gets worse.  You have a fever.  You have not pooped for 4 days.  You throw up (vomit).  You are not hungry.  You lose weight.  You are bleeding from the anus.  You have thin, pencil-like poop (stool). Get help right away if:  You have a fever, and your symptoms suddenly get worse.  You leak poop or have blood in your poop.  Your belly feels hard or bigger than normal (is bloated).  You have very bad belly pain.  You feel dizzy or you faint. This information is not intended to  replace advice given to you by your health care provider. Make sure you discuss any questions you have with your health care provider. Document Released: 08/24/2007 Document Revised: 09/25/2015 Document Reviewed: 08/26/2015 Elsevier Interactive Patient Education  2018 [employer]   IF you received an x-ray today, you will receive an invoice from [facility]. Please contact [facility] at [phone] with questions or concerns regarding your invoice.   IF you received labwork today, you will receive an invoice from [facility]. Please contact [facility] at [phone] with questions or concerns regarding your invoice.   Our billing staff will not be able to assist you with questions regarding bills from these companies.  You will be contacted with the lab results as soon as they are available. The fastest way to get your results is to activate your My Chart account. Instructions are located on the last page of this paperwork. If you have not heard from us regarding the results in 2 weeks, please contact this office.

## 2017-03-20 ENCOUNTER — Ambulatory Visit: Payer: Medicare HMO | Admitting: Internal Medicine

## 2017-03-30 ENCOUNTER — Encounter: Payer: Self-pay | Admitting: Internal Medicine

## 2017-03-30 ENCOUNTER — Other Ambulatory Visit: Payer: Self-pay | Admitting: Internal Medicine

## 2017-03-30 ENCOUNTER — Ambulatory Visit (INDEPENDENT_AMBULATORY_CARE_PROVIDER_SITE_OTHER): Payer: Medicare HMO | Admitting: Internal Medicine

## 2017-03-30 ENCOUNTER — Other Ambulatory Visit (INDEPENDENT_AMBULATORY_CARE_PROVIDER_SITE_OTHER): Payer: Medicare HMO

## 2017-03-30 VITALS — BP 150/98 | HR 72 | Temp 98.0°F | Ht 71.0 in | Wt 176.0 lb

## 2017-03-30 DIAGNOSIS — Z0001 Encounter for general adult medical examination with abnormal findings: Secondary | ICD-10-CM | POA: Insufficient documentation

## 2017-03-30 DIAGNOSIS — I10 Essential (primary) hypertension: Secondary | ICD-10-CM

## 2017-03-30 DIAGNOSIS — E785 Hyperlipidemia, unspecified: Secondary | ICD-10-CM | POA: Diagnosis not present

## 2017-03-30 DIAGNOSIS — Z0000 Encounter for general adult medical examination without abnormal findings: Secondary | ICD-10-CM

## 2017-03-30 HISTORY — DX: Hyperlipidemia, unspecified: E78.5

## 2017-03-30 LAB — CBC WITH DIFFERENTIAL/PLATELET
BASOS ABS: 0.1 10*3/uL (ref 0.0–0.1)
BASOS PCT: 0.7 % (ref 0.0–3.0)
EOS ABS: 0.3 10*3/uL (ref 0.0–0.7)
Eosinophils Relative: 4 % (ref 0.0–5.0)
HEMATOCRIT: 43.2 % (ref 39.0–52.0)
Hemoglobin: 14.1 g/dL (ref 13.0–17.0)
LYMPHS ABS: 2.1 10*3/uL (ref 0.7–4.0)
LYMPHS PCT: 28.8 % (ref 12.0–46.0)
MCHC: 32.6 g/dL (ref 30.0–36.0)
MCV: 83.1 fl (ref 78.0–100.0)
MONO ABS: 0.8 10*3/uL (ref 0.1–1.0)
Monocytes Relative: 10.5 % (ref 3.0–12.0)
NEUTROS ABS: 4.1 10*3/uL (ref 1.4–7.7)
NEUTROS PCT: 56 % (ref 43.0–77.0)
PLATELETS: 251 10*3/uL (ref 150.0–400.0)
RBC: 5.2 Mil/uL (ref 4.22–5.81)
RDW: 15 % (ref 11.5–15.5)
WBC: 7.2 10*3/uL (ref 4.0–10.5)

## 2017-03-30 LAB — URINALYSIS, ROUTINE W REFLEX MICROSCOPIC
BILIRUBIN URINE: NEGATIVE
HGB URINE DIPSTICK: NEGATIVE
KETONES UR: NEGATIVE
LEUKOCYTES UA: NEGATIVE
NITRITE: NEGATIVE
PH: 6.5 (ref 5.0–8.0)
Specific Gravity, Urine: 1.02 (ref 1.000–1.030)
TOTAL PROTEIN, URINE-UPE24: NEGATIVE
URINE GLUCOSE: NEGATIVE
UROBILINOGEN UA: 0.2 (ref 0.0–1.0)

## 2017-03-30 LAB — HEPATIC FUNCTION PANEL
ALT: 9 U/L (ref 0–53)
AST: 10 U/L (ref 0–37)
Albumin: 4 g/dL (ref 3.5–5.2)
Alkaline Phosphatase: 68 U/L (ref 39–117)
BILIRUBIN DIRECT: 0.1 mg/dL (ref 0.0–0.3)
TOTAL PROTEIN: 7.3 g/dL (ref 6.0–8.3)
Total Bilirubin: 0.4 mg/dL (ref 0.2–1.2)

## 2017-03-30 LAB — LIPID PANEL
CHOL/HDL RATIO: 4
CHOLESTEROL: 194 mg/dL (ref 0–200)
HDL: 48.8 mg/dL (ref >39.00)
LDL CALC: 124 mg/dL — AB (ref 0–99)
NonHDL: 145.32
TRIGLYCERIDES: 108 mg/dL (ref 0.0–149.0)
VLDL: 21.6 mg/dL (ref 0.0–40.0)

## 2017-03-30 LAB — BASIC METABOLIC PANEL
BUN: 14 mg/dL (ref 6–23)
CHLORIDE: 109 meq/L (ref 96–112)
CO2: 27 meq/L (ref 19–32)
Calcium: 9.4 mg/dL (ref 8.4–10.5)
Creatinine, Ser: 1.06 mg/dL (ref 0.40–1.50)
GFR: 89.16 mL/min (ref >60.00)
Glucose, Bld: 80 mg/dL (ref 70–99)
POTASSIUM: 4 meq/L (ref 3.5–5.1)
Sodium: 143 mEq/L (ref 135–145)

## 2017-03-30 LAB — PSA: PSA: 2.68 ng/mL (ref 0.10–4.00)

## 2017-03-30 LAB — TSH: TSH: 0.64 u[IU]/mL (ref 0.35–4.50)

## 2017-03-30 MED ORDER — ROSUVASTATIN CALCIUM 10 MG PO TABS: 10.0000 mg | ORAL_TABLET | Freq: Every day | ORAL | 3 refills | Status: DC

## 2017-03-30 MED ORDER — LISINOPRIL 10 MG PO TABS: 10.0000 mg | ORAL_TABLET | Freq: Every day | ORAL | 3 refills | Status: DC

## 2017-03-30 NOTE — Progress Notes (Signed)
Subjective:    Patient ID: Jesse Williams, male    DOB: [birth year], 67 y.o.   MRN: [id number]  HPI  Here for wellness and f/u;  Overall doing ok;  Pt denies Chest pain, worsening SOB, DOE, wheezing, orthopnea, PND, worsening LE edema, palpitations, dizziness or syncope.  Pt denies neurological change such as new headache, facial or extremity weakness.  Pt denies polydipsia, polyuria, or low sugar symptoms. Pt states overall good compliance with treatment and medications, good tolerability, and has been trying to follow appropriate diet.  Pt denies worsening depressive symptoms, suicidal ideation or panic. No fever, night sweats, wt loss, loss of appetite, or other constitutional symptoms.  Pt states good ability with ADL's, has low fall risk, home safety reviewed and adequate, no other significant changes in hearing or vision, and only occasionally active with exercise. Declines immunizations.   States BP known elevated since oct 2018.   Ran out of ACE, needs refill. No other interval hx or complaints Past Medical History:  Diagnosis Date  . HLD (hyperlipidemia) 03/30/2017  . Hypertension    No past surgical history on file.  reports that he has been smoking.  he has never used smokeless tobacco. He reports that he does not drink alcohol. His drug history is not on file. family history is not on file. No Known Allergies Current Outpatient Medications on File Prior to Visit  Medication Sig Dispense Refill  . cyclobenzaprine (FLEXERIL) 10 MG tablet Take 0.5-1 tablets (5-10 mg total) by mouth 3 (three) times daily as needed. 30 tablet 0  . HYDROcodone-acetaminophen (NORCO/VICODIN) 5-325 MG tablet Take 1 tablet by mouth every 6 (six) hours as needed. 20 tablet 0  . polyethylene glycol powder (GLYCOLAX/MIRALAX) powder Take 17 g by mouth 2 (two) times daily as needed. 289 g 1   No current facility-administered medications on file prior to visit.    Review of Systems Constitutional: Negative for  other unusual diaphoresis, sweats, appetite or weight changes HENT: Negative for other worsening hearing loss, ear pain, facial swelling, mouth sores or neck stiffness.   Eyes: Negative for other worsening pain, redness or other visual disturbance.  Respiratory: Negative for other stridor or swelling Cardiovascular: Negative for other palpitations or other chest pain  Gastrointestinal: Negative for worsening diarrhea or loose stools, blood in stool, distention or other pain Genitourinary: Negative for hematuria, flank pain or other change in urine volume.  Musculoskeletal: Negative for myalgias or other joint swelling.  Skin: Negative for other color change, or other wound or worsening drainage.  Neurological: Negative for other syncope or numbness. Hematological: Negative for other adenopathy or swelling Psychiatric/Behavioral: Negative for hallucinations, other worsening agitation, SI, self-injury, or new decreased concentration All other system neg per pt    Objective:   Physical Exam BP (!) 150/98   Pulse 72   Temp 98 F (36.7 C) (Oral)   Ht 5\' 11"  (1.803 m)   Wt 176 lb (79.8 kg)   SpO2 98%   BMI 24.55 kg/m  VS noted,  Constitutional: Pt is oriented to person, place, and time. Appears well-developed and well-nourished, in no significant distress and comfortable Head: Normocephalic and atraumatic  Eyes: Conjunctivae and EOM are normal. Pupils are equal, round, and reactive to light Right Ear: External ear normal without discharge Left Ear: External ear normal without discharge Nose: Nose without discharge or deformity Mouth/Throat: Oropharynx is without other ulcerations and moist  Neck: Normal range of motion. Neck supple. No JVD present. No tracheal deviation  present or significant neck LA or mass Cardiovascular: Normal rate, regular rhythm, normal heart sounds and intact distal pulses.   Pulmonary/Chest: WOB normal and breath sounds without rales or wheezing  Abdominal:  Soft. Bowel sounds are normal. NT. No HSM  Musculoskeletal: Normal range of motion. Exhibits no edema Lymphadenopathy: Has no other cervical adenopathy.  Neurological: Pt is alert and oriented to person, place, and time. Pt has normal reflexes. No cranial nerve deficit. Motor grossly intact, Gait intact Skin: Skin is warm and dry. No rash noted or new ulcerations Psychiatric:  Has normal mood and affect. Behavior is normal without agitation No other exam findings     Assessment & Plan:

## 2017-03-30 NOTE — Patient Instructions (Signed)
Please continue all other medications as before, and refills have been done if requested including the blood pressure pill  Please have the pharmacy call with any other refills you may need.  Please continue your efforts at being more active, low cholesterol diet, and weight control.  You are otherwise up to date with prevention measures today.  Please keep your appointments with your specialists as you may have planned  Please go to the LAB in the Basement (turn left off the elevator) for the tests to be done today  You will be contacted by phone if any changes need to be made immediately.  Otherwise, you will receive a letter about your results with an explanation, but please check with MyChart first.  Please remember to sign up for MyChart if you have not done so, as this will be important to you in the future with finding out test results, communicating by private email, and scheduling acute appointments online when needed.  Please return in 6 months, or sooner if needed

## 2017-03-31 ENCOUNTER — Telehealth: Payer: Self-pay

## 2017-03-31 NOTE — Telephone Encounter (Signed)
Pt's sister has been informed, number listed is hers since he doesn't have a phone at this time, she will pass the msg along to him and make sure meds are picked up from the pharmacy.

## 2017-03-31 NOTE — Telephone Encounter (Signed)
-----   Message from Corwin LevinsJames W John, MD sent at 03/30/2017  9:09 PM EST ----- Letter sent, cont same tx except  The test results show that your current treatment is OK, except the LDL cholesterol is moderately high.  Please follow a lower cholesterol diet, and start crestor 10 mg per day.  I will send a new prescription and you should hear from the office as well.Lendell Caprice.    Deuntae Kocsis to please inform pt, I will do rx

## 2017-04-01 ENCOUNTER — Encounter: Payer: Self-pay | Admitting: Internal Medicine

## 2017-04-01 NOTE — Assessment & Plan Note (Signed)
Persistently elevated o/w stable overall by history and exam, recent data reviewed with pt, and pt adamant BP at home is better controlled, to restart ACE,  to f/u any worsening symptoms or concerns BP Readings from Last 3 Encounters:  03/30/17 (!) 150/98  03/17/17 (!) 148/88  03/01/17 (!) 160/97

## 2017-04-01 NOTE — Assessment & Plan Note (Signed)

## 2017-04-11 ENCOUNTER — Telehealth: Payer: Self-pay | Admitting: Internal Medicine

## 2017-04-11 DIAGNOSIS — M47812 Spondylosis without myelopathy or radiculopathy, cervical region: Secondary | ICD-10-CM

## 2017-04-11 DIAGNOSIS — S161XXA Strain of muscle, fascia and tendon at neck level, initial encounter: Secondary | ICD-10-CM

## 2017-04-11 NOTE — Telephone Encounter (Signed)
Relation to pt: self  Call back number: [phone] Pharmacy: [id number], [place] [facility] [address] [phone] ([phone] (Fax)    Reason for call:  Patient contacted pharmacy requesting HYDROcodone-acetaminophen (NORCO/VICODIN) 5-325 MG tablet, patient was advised by pharmacy to contact PCP, patient aware 48 to 72 hour turn around, please advise

## 2017-04-12 NOTE — Telephone Encounter (Signed)
Check [place] registry last filled 03/17/2017../lmb  

## 2017-04-12 NOTE — Telephone Encounter (Signed)
I do not normally prescribe schedule II or higher medication as part of chronic pain  I can refer to pain management if he wants this, thanks

## 2017-04-13 NOTE — Telephone Encounter (Signed)
Called pt no answer LMOM w/MD response../lm,b 

## 2017-05-02 ENCOUNTER — Telehealth: Payer: Self-pay | Admitting: Internal Medicine

## 2017-05-02 MED ORDER — LOSARTAN POTASSIUM 100 MG PO TABS: 100.0000 mg | ORAL_TABLET | Freq: Every day | ORAL | 3 refills | Status: DC

## 2017-05-02 NOTE — Telephone Encounter (Signed)
Tried calling pt no answer and can't leave msg due to vm not set-up. Will retry again later.Marland Kitchen.Raechel Chute/lmb

## 2017-05-02 NOTE — Telephone Encounter (Signed)
Copied from CRM [id number]. Topic: Quick Communication - See Telephone Encounter >> May 02, 2017  9:26 AM Windy KalataMichael, Zoey Gilkeson L, NT wrote: CRM for notification. See Telephone encounter for:  05/02/17.  Pt is calling stating the Lisinopril 10mg  is making him cough up continuous fleam. Would like to know other options or what can help this.

## 2017-05-02 NOTE — Telephone Encounter (Signed)
Ok to change the lisinopril to losartan 100 mg - 1 per day which is similar, but has no cough side effect

## 2017-05-02 NOTE — Telephone Encounter (Signed)
Tried calling pt again still no answer [facility]lmb

## 2017-05-02 NOTE — Addendum Note (Signed)
Addended by: Corwin LevinsJOHN, Zakaiya Lares W on: 05/02/2017 12:38 PM   Modules accepted: Orders

## 2017-05-02 NOTE — Telephone Encounter (Signed)
Pt states cough since starting Lisinopril; requesting medication change if possible.

## 2017-06-19 ENCOUNTER — Encounter: Payer: Self-pay | Admitting: Physician Assistant

## 2017-09-27 ENCOUNTER — Ambulatory Visit: Payer: Medicare HMO | Admitting: Internal Medicine

## 2017-09-27 DIAGNOSIS — Z0289 Encounter for other administrative examinations: Secondary | ICD-10-CM

## 2017-10-17 ENCOUNTER — Ambulatory Visit: Payer: Medicare HMO | Admitting: Internal Medicine

## 2017-10-23 ENCOUNTER — Ambulatory Visit: Payer: Medicare HMO | Admitting: Internal Medicine

## 2017-10-23 DIAGNOSIS — Z0289 Encounter for other administrative examinations: Secondary | ICD-10-CM

## 2017-11-06 ENCOUNTER — Ambulatory Visit: Payer: Medicare HMO | Admitting: Internal Medicine

## 2017-12-19 ENCOUNTER — Encounter: Payer: Self-pay | Admitting: Internal Medicine

## 2017-12-19 ENCOUNTER — Ambulatory Visit (INDEPENDENT_AMBULATORY_CARE_PROVIDER_SITE_OTHER): Payer: Medicare HMO | Admitting: Internal Medicine

## 2017-12-19 VITALS — BP 138/86 | HR 78 | Temp 98.2°F | Ht 71.0 in | Wt 160.0 lb

## 2017-12-19 DIAGNOSIS — Z0000 Encounter for general adult medical examination without abnormal findings: Secondary | ICD-10-CM

## 2017-12-19 DIAGNOSIS — F32A Depression, unspecified: Secondary | ICD-10-CM | POA: Insufficient documentation

## 2017-12-19 DIAGNOSIS — F17200 Nicotine dependence, unspecified, uncomplicated: Secondary | ICD-10-CM

## 2017-12-19 DIAGNOSIS — E785 Hyperlipidemia, unspecified: Secondary | ICD-10-CM | POA: Diagnosis not present

## 2017-12-19 DIAGNOSIS — I10 Essential (primary) hypertension: Secondary | ICD-10-CM

## 2017-12-19 DIAGNOSIS — F329 Major depressive disorder, single episode, unspecified: Secondary | ICD-10-CM | POA: Diagnosis not present

## 2017-12-19 DIAGNOSIS — R69 Illness, unspecified: Secondary | ICD-10-CM | POA: Diagnosis not present

## 2017-12-19 MED ORDER — CITALOPRAM HYDROBROMIDE 10 MG PO TABS: 10.0000 mg | ORAL_TABLET | Freq: Every day | ORAL | 3 refills | Status: DC

## 2017-12-19 MED ORDER — LOSARTAN POTASSIUM 100 MG PO TABS: 100.0000 mg | ORAL_TABLET | Freq: Every day | ORAL | 3 refills | Status: DC

## 2017-12-19 MED ORDER — ROSUVASTATIN CALCIUM 10 MG PO TABS: 10.0000 mg | ORAL_TABLET | Freq: Every day | ORAL | 3 refills | Status: DC

## 2017-12-19 NOTE — Assessment & Plan Note (Signed)
Mild to mod, for celexa 10 qd,,  to f/u any worsening symptoms or concerns

## 2017-12-19 NOTE — Assessment & Plan Note (Signed)
Urged to quit smoking, declines pulm referral for start LDCT screening

## 2017-12-19 NOTE — Assessment & Plan Note (Signed)
Pt encouraged to restart the crestor, low chol diet,  to f/u any worsening symptoms or concerns, f/u lab next visit

## 2017-12-19 NOTE — Patient Instructions (Addendum)
Please take all new medication as prescribed - the celexa 10 mg per day  Please continue all other medications as before, and refills have been done if requested, including restart of the cholesterol medication  Please stop smoking  Please call if you change your mind about the flu shot, or the referral to Pulmonary to start the yearly CT scan of the chest program  Please have the pharmacy call with any other refills you may need.  Please keep your appointments with your specialists as you may have planned  Please return in 6 months, or sooner if needed, with Lab testing done 3-5 days before

## 2017-12-19 NOTE — Progress Notes (Signed)
Subjective:    Patient ID: Jesse Williams, male    DOB: [birth year], 67 y.o.   MRN: [id number]  HPI  Here to f/u; overall doing ok,  Pt denies chest pain, increasing sob or doe, wheezing, orthopnea, PND, increased LE swelling, palpitations, dizziness or syncope.  Pt denies new neurological symptoms such as new headache, or facial or extremity weakness or numbness.  Pt denies polydipsia, polyuria, or low sugar episode.  Pt states overall good compliance with meds, mostly trying to follow appropriate diet, with wt overall stable,  Not taking the statin now but willing to restart.  Has had mild worsening depressive symptoms, but no suicidal ideation, or panic.  Still smoking - Smoking 1ppd since 67yo. Not ready to quit Past Medical History:  Diagnosis Date  . HLD (hyperlipidemia) 03/30/2017  . Hypertension    No past surgical history on file.  reports that he has been smoking. He has never used smokeless tobacco. He reports that he does not drink alcohol. His drug history is not on file. family history is not on file. No Known Allergies Current Outpatient Medications on File Prior to Visit  Medication Sig Dispense Refill  . HYDROcodone-acetaminophen (NORCO/VICODIN) 5-325 MG tablet Take 1 tablet by mouth every 6 (six) hours as needed. 20 tablet 0  . polyethylene glycol powder (GLYCOLAX/MIRALAX) powder Take 17 g by mouth 2 (two) times daily as needed. 289 g 1   No current facility-administered medications on file prior to visit.    Review of Systems  Constitutional: Negative for other unusual diaphoresis or sweats HENT: Negative for ear discharge or swelling Eyes: Negative for other worsening visual disturbances Respiratory: Negative for stridor or other swelling  Gastrointestinal: Negative for worsening distension or other blood Genitourinary: Negative for retention or other urinary change Musculoskeletal: Negative for other MSK pain or swelling Skin: Negative for color change or other new  lesions Neurological: Negative for worsening tremors and other numbness  Psychiatric/Behavioral: Negative for worsening agitation or other fatigue No other exam findings    Objective:   Physical Exam BP 138/86   Pulse 78   Temp 98.2 F (36.8 C) (Oral)   Ht 5\' 11"  (1.803 m)   Wt 160 lb (72.6 kg)   SpO2 97%   BMI 22.32 kg/m  VS noted,  Constitutional: Pt appears in NAD HENT: Head: NCAT.  Right Ear: External ear normal.  Left Ear: External ear normal.  Eyes: . Pupils are equal, round, and reactive to light. Conjunctivae and EOM are normal Nose: without d/c or deformity Neck: Neck supple. Gross normal ROM Cardiovascular: Normal rate and regular rhythm.   Pulmonary/Chest: Effort normal and breath sounds without rales or wheezing.  Abd:  Soft, NT, ND, + BS, no organomegaly Neurological: Pt is alert. At baseline orientation, motor grossly intact Skin: Skin is warm. No rashes, other new lesions, no LE edema Psychiatric: Pt behavior is normal without agitation , + mild depressed affect No other exam findings Lab Results  Component Value Date   WBC 7.2 03/30/2017   HGB 14.1 03/30/2017   HCT 43.2 03/30/2017   PLT 251.0 03/30/2017   GLUCOSE 80 03/30/2017   CHOL 194 03/30/2017   TRIG 108.0 03/30/2017   HDL 48.80 03/30/2017   LDLCALC 124 (H) 03/30/2017   ALT 9 03/30/2017   AST 10 03/30/2017   NA 143 03/30/2017   K 4.0 03/30/2017   CL 109 03/30/2017   CREATININE 1.06 03/30/2017   BUN 14 03/30/2017   CO2 27  03/30/2017   TSH 0.64 03/30/2017   PSA 2.68 03/30/2017       Assessment & Plan:

## 2017-12-19 NOTE — Assessment & Plan Note (Signed)
stable overall by history and exam, recent data reviewed with pt, and pt to continue medical treatment as before,  to f/u any worsening symptoms or concerns  

## 2018-06-05 IMAGING — CR DG CHEST 2V
2 series · 2 of 2 positions shown · non-contrast
Comparison: 09/12/2016

CLINICAL DATA: Body aches.  High blood pressure.

EXAM:
CHEST  2 VIEW

[chest lat]
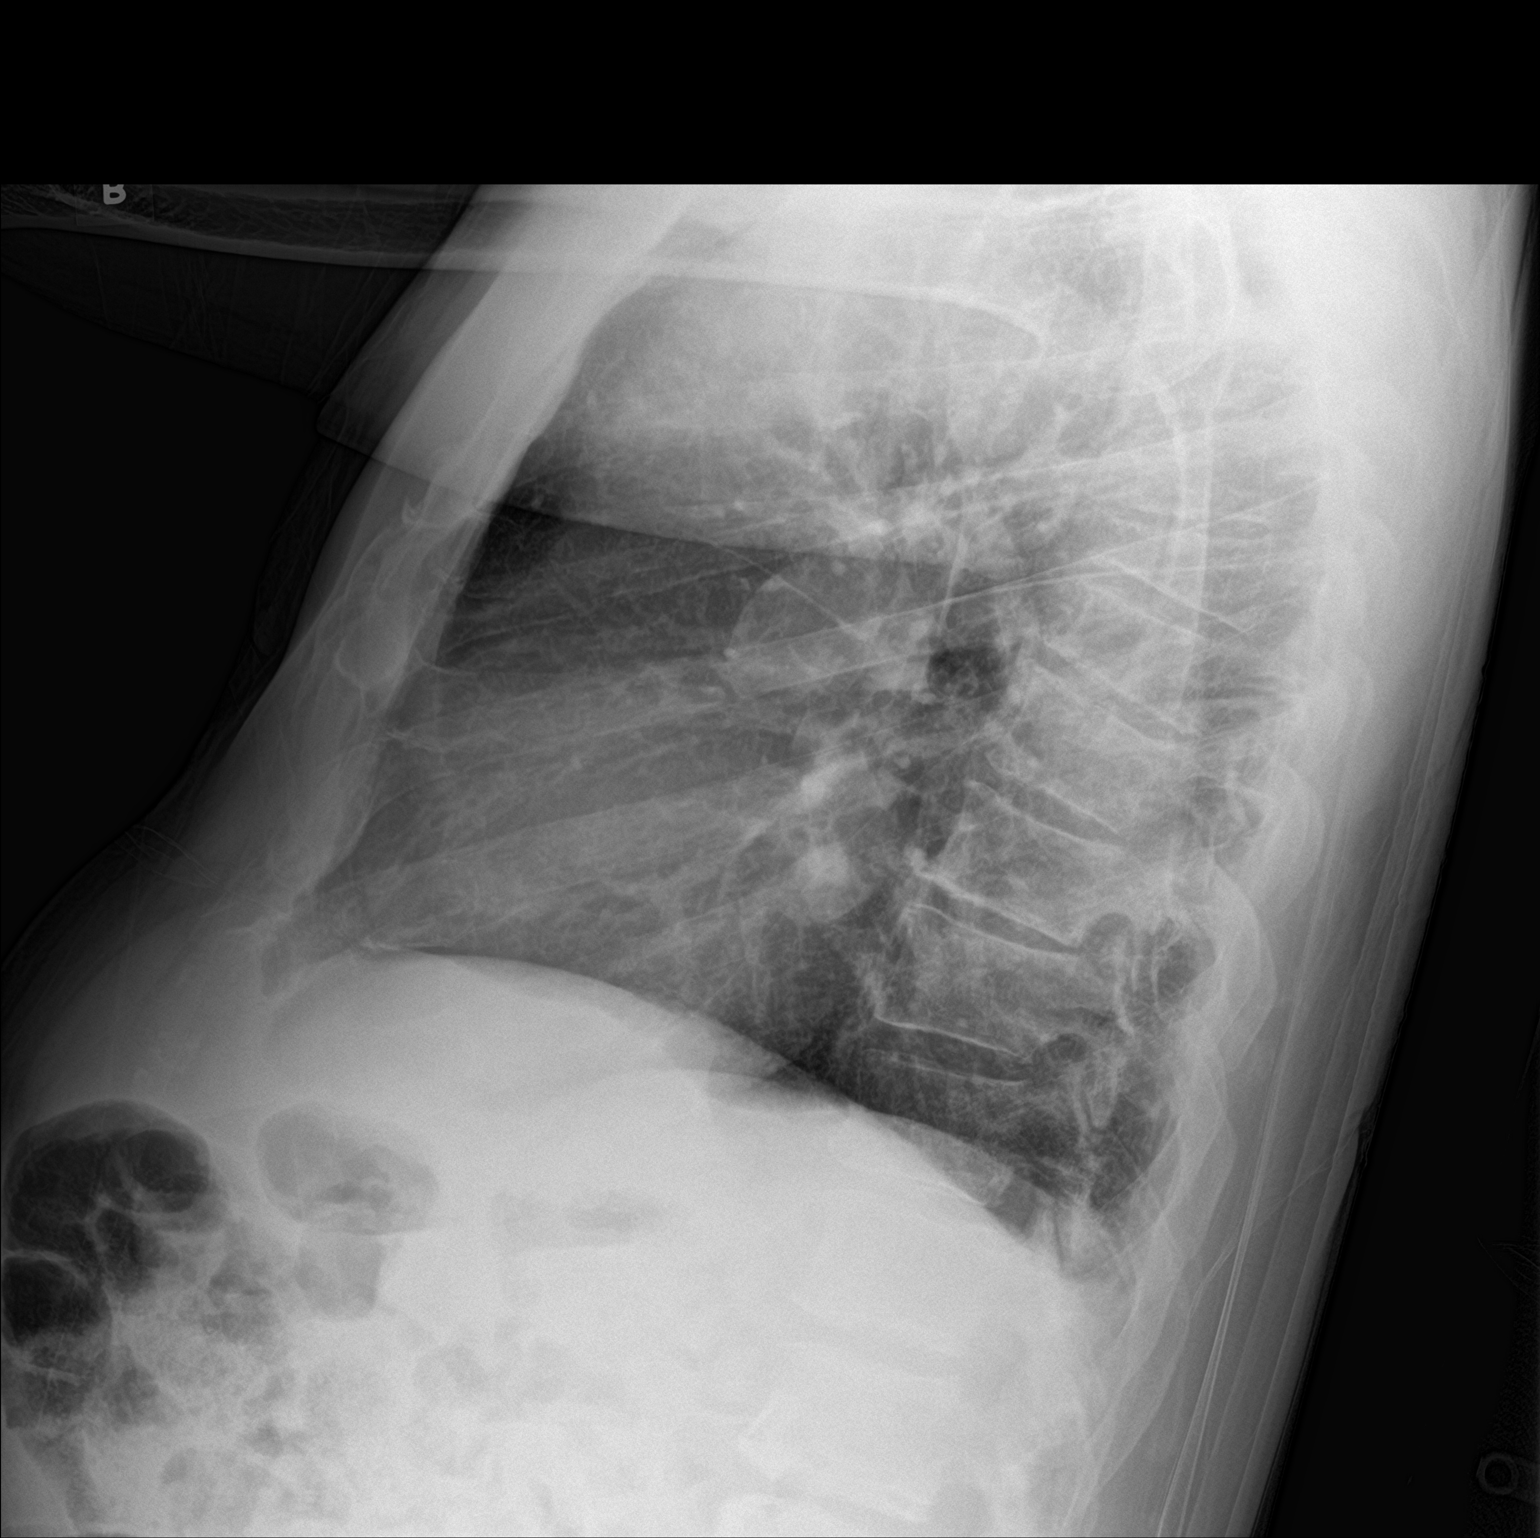

[chest ap]
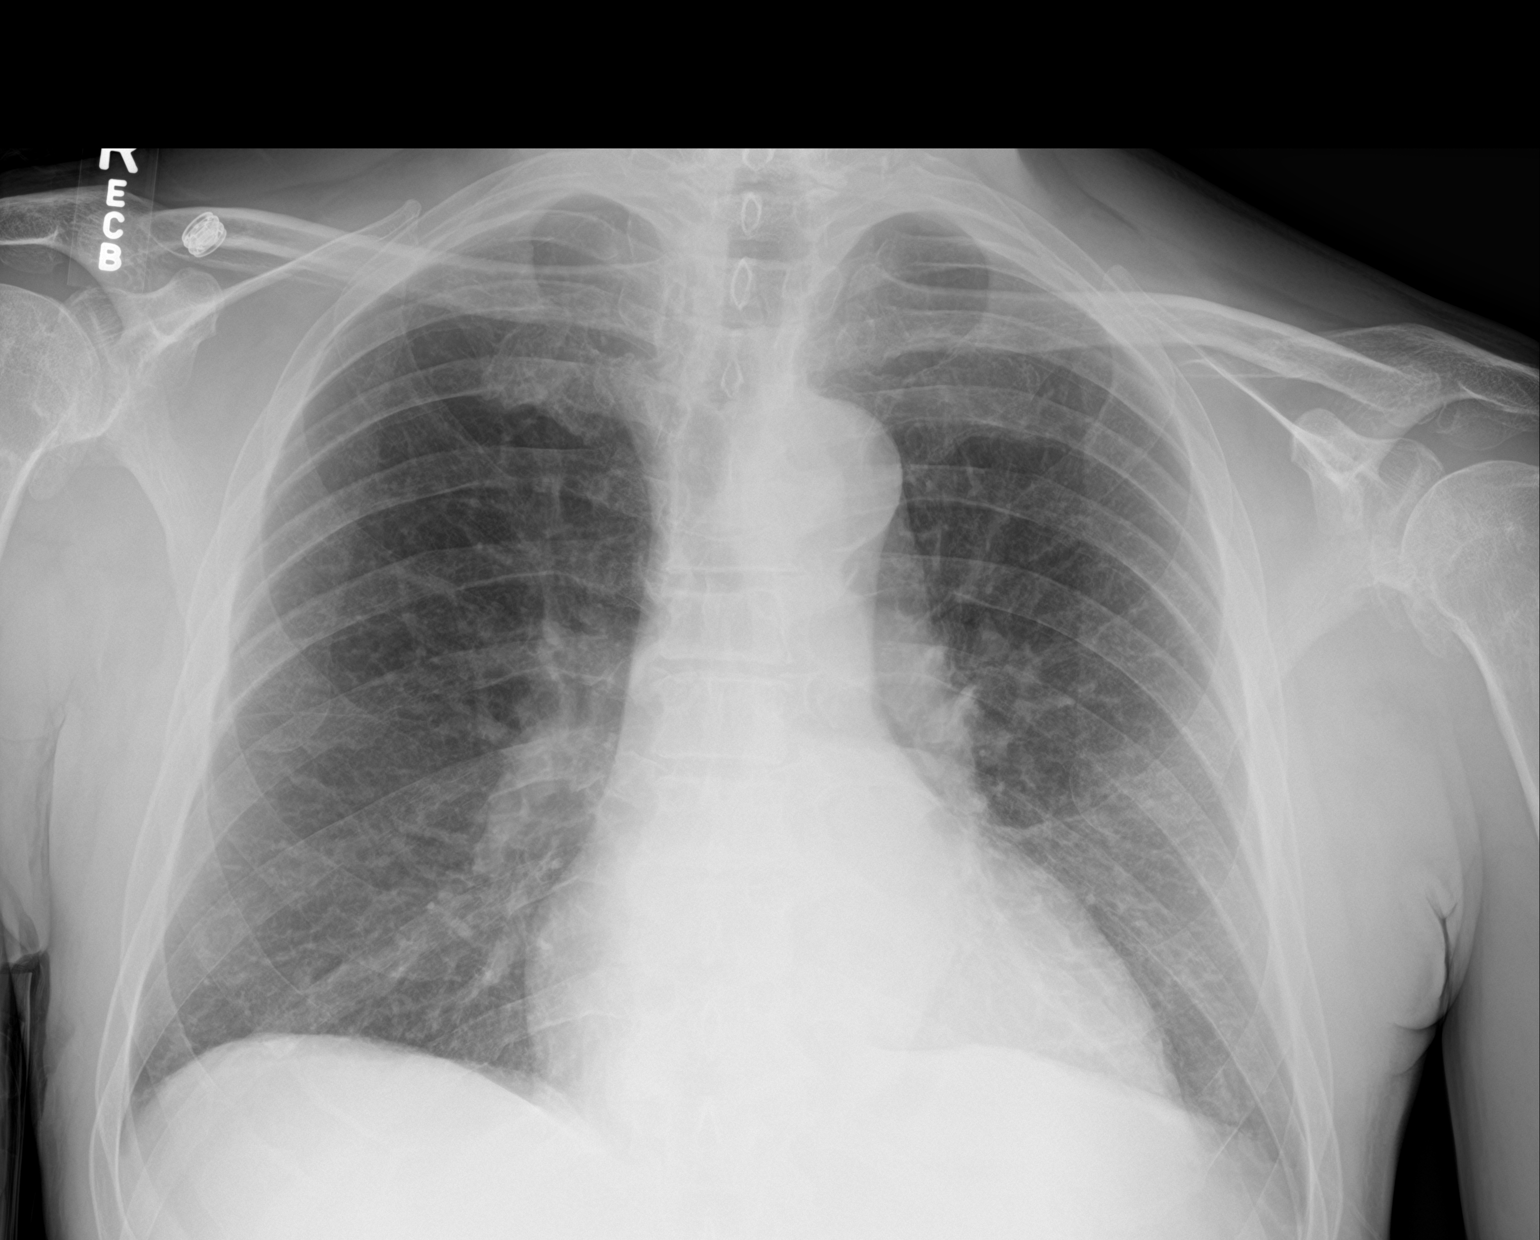

[2 of 2 positions shown; findings below may reference images not displayed]

FINDINGS: Cardiomediastinal silhouette is normal. Mediastinal contours appear
intact. Calcific atherosclerotic disease and tortuosity of the
aorta.

There is no evidence of focal airspace consolidation, pleural
effusion or pneumothorax.

Stable compression deformity of 1 of the upper thoracic vertebral
bodies, when compared to 09/12/2016. Soft tissues are grossly
normal.
IMPRESSION: Calcific atherosclerotic disease of the aorta.

Age-indeterminate compression fracture of 1 of the upper thoracic
vertebral bodies.

No evidence of focal airspace consolidation.

## 2018-06-22 ENCOUNTER — Ambulatory Visit: Payer: Medicare HMO | Admitting: Internal Medicine

## 2018-09-17 DIAGNOSIS — Z20828 Contact with and (suspected) exposure to other viral communicable diseases: Secondary | ICD-10-CM | POA: Diagnosis not present

## 2019-01-19 ENCOUNTER — Other Ambulatory Visit: Payer: Self-pay | Admitting: Internal Medicine

## 2019-01-20 ENCOUNTER — Other Ambulatory Visit: Payer: Self-pay | Admitting: Internal Medicine

## 2019-02-14 ENCOUNTER — Other Ambulatory Visit: Payer: Self-pay | Admitting: Internal Medicine

## 2019-02-19 ENCOUNTER — Other Ambulatory Visit: Payer: Self-pay | Admitting: Internal Medicine

## 2019-04-09 ENCOUNTER — Other Ambulatory Visit: Payer: Self-pay | Admitting: Internal Medicine

## 2019-04-09 NOTE — Telephone Encounter (Signed)
Sorry unable for refills as pt was last seen jan 2019

## 2019-04-10 ENCOUNTER — Other Ambulatory Visit: Payer: Self-pay | Admitting: Internal Medicine

## 2019-04-10 ENCOUNTER — Telehealth: Payer: Self-pay

## 2019-04-10 DIAGNOSIS — K5900 Constipation, unspecified: Secondary | ICD-10-CM

## 2019-04-10 NOTE — Telephone Encounter (Signed)
Copied from CRM [id number]. Topic: Quick Communication - Rx Refill/Question >> Apr 10, 2019 11:30 AM Jaquita Rector A wrote: Medication: losartan (COZAAR) 100 MG tablet, polyethylene glycol powder (GLYCOLAX/MIRALAX) powder, rosuvastatin (CRESTOR) 10 MG tablet  Has the patient contacted their pharmacy? Yes.   (Agent: If no, request that the patient contact the pharmacy for the refill.) (Agent: If yes, when and what did the pharmacy advise?)  Preferred Pharmacy (with phone number or street name): [id number] [place], [place] - [address]  Phone:  [phone] Fax:  [phone]     Agent: Please be advised that RX refills may take up to 3 business days. We ask that you follow-up with your pharmacy.

## 2019-04-10 NOTE — Telephone Encounter (Signed)
-----   Message from Jesse Williams, [place] sent at 04/10/2019  8:11 AM EST ----- Regarding: Needs OV Please call pt. Needs OV per Dr. Jonny Ruiz. Thanks!

## 2019-04-10 NOTE — Telephone Encounter (Signed)
F/u   Call home phone number - left a message with family member to call the office back .

## 2019-04-25 ENCOUNTER — Encounter: Payer: Self-pay | Admitting: Internal Medicine

## 2019-04-25 ENCOUNTER — Ambulatory Visit (INDEPENDENT_AMBULATORY_CARE_PROVIDER_SITE_OTHER): Payer: Medicare HMO | Admitting: Internal Medicine

## 2019-04-25 ENCOUNTER — Other Ambulatory Visit: Payer: Self-pay

## 2019-04-25 VITALS — BP 170/110 | HR 72 | Temp 97.9°F | Ht 71.0 in | Wt 173.0 lb

## 2019-04-25 DIAGNOSIS — Z0001 Encounter for general adult medical examination with abnormal findings: Secondary | ICD-10-CM

## 2019-04-25 DIAGNOSIS — E611 Iron deficiency: Secondary | ICD-10-CM

## 2019-04-25 DIAGNOSIS — F32A Depression, unspecified: Secondary | ICD-10-CM

## 2019-04-25 DIAGNOSIS — I10 Essential (primary) hypertension: Secondary | ICD-10-CM

## 2019-04-25 DIAGNOSIS — E538 Deficiency of other specified B group vitamins: Secondary | ICD-10-CM | POA: Diagnosis not present

## 2019-04-25 DIAGNOSIS — E559 Vitamin D deficiency, unspecified: Secondary | ICD-10-CM

## 2019-04-25 DIAGNOSIS — N138 Other obstructive and reflux uropathy: Secondary | ICD-10-CM | POA: Diagnosis not present

## 2019-04-25 DIAGNOSIS — F329 Major depressive disorder, single episode, unspecified: Secondary | ICD-10-CM | POA: Diagnosis not present

## 2019-04-25 DIAGNOSIS — N401 Enlarged prostate with lower urinary tract symptoms: Secondary | ICD-10-CM | POA: Diagnosis not present

## 2019-04-25 DIAGNOSIS — E785 Hyperlipidemia, unspecified: Secondary | ICD-10-CM | POA: Diagnosis not present

## 2019-04-25 DIAGNOSIS — R69 Illness, unspecified: Secondary | ICD-10-CM | POA: Diagnosis not present

## 2019-04-25 LAB — URINALYSIS, ROUTINE W REFLEX MICROSCOPIC
Bilirubin Urine: NEGATIVE
Hgb urine dipstick: NEGATIVE
Ketones, ur: NEGATIVE
Leukocytes,Ua: NEGATIVE
Nitrite: NEGATIVE
RBC / HPF: NONE SEEN (ref 0–?)
Specific Gravity, Urine: 1.025 (ref 1.000–1.030)
Total Protein, Urine: NEGATIVE
Urine Glucose: NEGATIVE
Urobilinogen, UA: 0.2 (ref 0.0–1.0)
pH: 6 (ref 5.0–8.0)

## 2019-04-25 LAB — CBC WITH DIFFERENTIAL/PLATELET
Basophils Absolute: 0 10*3/uL (ref 0.0–0.1)
Basophils Relative: 0.4 % (ref 0.0–3.0)
Eosinophils Absolute: 0.2 10*3/uL (ref 0.0–0.7)
Eosinophils Relative: 3.7 % (ref 0.0–5.0)
HCT: 42.6 % (ref 39.0–52.0)
Hemoglobin: 13.9 g/dL (ref 13.0–17.0)
Lymphocytes Relative: 27.1 % (ref 12.0–46.0)
Lymphs Abs: 1.6 10*3/uL (ref 0.7–4.0)
MCHC: 32.7 g/dL (ref 30.0–36.0)
MCV: 84.3 fl (ref 78.0–100.0)
Monocytes Absolute: 0.6 10*3/uL (ref 0.1–1.0)
Monocytes Relative: 9.1 % (ref 3.0–12.0)
Neutro Abs: 3.6 10*3/uL (ref 1.4–7.7)
Neutrophils Relative %: 59.7 % (ref 43.0–77.0)
Platelets: 221 10*3/uL (ref 150.0–400.0)
RBC: 5.06 Mil/uL (ref 4.22–5.81)
RDW: 15.4 % (ref 11.5–15.5)
WBC: 6.1 10*3/uL (ref 4.0–10.5)

## 2019-04-25 LAB — VITAMIN D 25 HYDROXY (VIT D DEFICIENCY, FRACTURES): VITD: 10.94 ng/mL — ABNORMAL LOW (ref 30.00–100.00)

## 2019-04-25 LAB — BASIC METABOLIC PANEL
BUN: 18 mg/dL (ref 6–23)
CO2: 26 mEq/L (ref 19–32)
Calcium: 9.4 mg/dL (ref 8.4–10.5)
Chloride: 111 mEq/L (ref 96–112)
Creatinine, Ser: 1.12 mg/dL (ref 0.40–1.50)
GFR: 78.25 mL/min (ref >60.00)
Glucose, Bld: 83 mg/dL (ref 70–99)
Potassium: 4 mEq/L (ref 3.5–5.1)
Sodium: 142 mEq/L (ref 135–145)

## 2019-04-25 LAB — HEPATIC FUNCTION PANEL
ALT: 11 U/L (ref 0–53)
AST: 14 U/L (ref 0–37)
Albumin: 4 g/dL (ref 3.5–5.2)
Alkaline Phosphatase: 66 U/L (ref 39–117)
Bilirubin, Direct: 0.1 mg/dL (ref 0.0–0.3)
Total Bilirubin: 0.4 mg/dL (ref 0.2–1.2)
Total Protein: 6.7 g/dL (ref 6.0–8.3)

## 2019-04-25 LAB — PSA: PSA: 1.41 ng/mL (ref 0.10–4.00)

## 2019-04-25 LAB — LIPID PANEL
Cholesterol: 194 mg/dL (ref 0–200)
HDL: 60 mg/dL (ref >39.00)
LDL Cholesterol: 120 mg/dL — ABNORMAL HIGH (ref 0–99)
NonHDL: 134.29
Total CHOL/HDL Ratio: 3
Triglycerides: 71 mg/dL (ref 0.0–149.0)
VLDL: 14.2 mg/dL (ref 0.0–40.0)

## 2019-04-25 LAB — IBC PANEL
Iron: 80 ug/dL (ref 42–165)
Saturation Ratios: 25.1 % (ref 20.0–50.0)
Transferrin: 228 mg/dL (ref 212.0–360.0)

## 2019-04-25 LAB — VITAMIN B12: Vitamin B-12: 238 pg/mL (ref 211–911)

## 2019-04-25 LAB — TSH: TSH: 0.68 u[IU]/mL (ref 0.35–4.50)

## 2019-04-25 MED ORDER — TAMSULOSIN HCL 0.4 MG PO CAPS: 0.4000 mg | ORAL_CAPSULE | Freq: Every day | ORAL | 3 refills | Status: DC

## 2019-04-25 MED ORDER — CITALOPRAM HYDROBROMIDE 10 MG PO TABS: 10.0000 mg | ORAL_TABLET | Freq: Every day | ORAL | 3 refills | Status: DC

## 2019-04-25 MED ORDER — LOSARTAN POTASSIUM 100 MG PO TABS: 100.0000 mg | ORAL_TABLET | Freq: Every day | ORAL | 3 refills | Status: DC

## 2019-04-25 MED ORDER — ROSUVASTATIN CALCIUM 10 MG PO TABS: 10.0000 mg | ORAL_TABLET | Freq: Every day | ORAL | 3 refills | Status: DC

## 2019-04-25 NOTE — Assessment & Plan Note (Addendum)
For flomax asd, consider urology if not improved  I spent 30 minutes preparing to see the patient by review of recent labs, imaging and procedures, obtaining and reviewing separately obtained history, communicating with the patient and family or caregiver, ordering medications, tests or procedures, and documenting clinical information in the EHR including the differential Dx, treatment, and any further evaluation and other management of BPH, depression, HTN, HLD

## 2019-04-25 NOTE — Assessment & Plan Note (Signed)

## 2019-04-25 NOTE — Progress Notes (Signed)
   Subjective:    Patient ID: Jesse Williams, male    DOB: 08-30-1950, 69 y.o.   MRN: [id number]  HPI    Here for wellness and f/u;  Overall doing ok;  Pt denies Chest pain, worsening SOB, DOE, wheezing, orthopnea, PND, worsening LE edema, palpitations, dizziness or syncope.  Pt denies neurological change such as new headache, facial or extremity weakness.  Pt denies polydipsia, polyuria, or low sugar symptoms. Pt states overall good compliance with treatment and medications, good tolerability, and has been trying to follow appropriate diet.  Pt denies worsening depressive symptoms, suicidal ideation or panic. No fever, night sweats, wt loss, loss of appetite, or other constitutional symptoms.  Pt states good ability with ADL's, has low fall risk, home safety reviewed and adequate, no other significant changes in hearing or vision, and only occasionally active with exercise. Now retired, living alone, not a lot to do when bad weather, crazy dreams. Also Denies urinary symptoms such as dysuria, frequency, urgency, flank pain, hematuria or n/v, fever, chills, but has worsening slow flow, mild retention and nocturia\ Past Medical History:  Diagnosis Date  . HLD (hyperlipidemia) 03/30/2017  . Hypertension    No past surgical history on file.  reports that he has been smoking. He has never used smokeless tobacco. He reports that he does not drink alcohol. No history on file for drug. family history is not on file. No Known Allergies Current Outpatient Medications on File Prior to Visit  Medication Sig Dispense Refill  . HYDROcodone-acetaminophen (NORCO/VICODIN) 5-325 MG tablet Take 1 tablet by mouth every 6 (six) hours as needed. 20 tablet 0  . polyethylene glycol powder (GLYCOLAX/MIRALAX) powder Take 17 g by mouth 2 (two) times daily as needed. 289 g 1   No current facility-administered medications on file prior to visit.   Review of Systems All otherwise neg per pt     Objective:   Physical  Exam BP (!) 170/110   Pulse 72   Temp 97.9 F (36.6 C)   Ht 5\' 11"  (1.803 m)   Wt 173 lb (78.5 kg)   SpO2 99%   BMI 24.13 kg/m  VS noted,  Constitutional: Pt appears in NAD HENT: Head: NCAT.  Right Ear: External ear normal.  Left Ear: External ear normal.  Eyes: . Pupils are equal, round, and reactive to light. Conjunctivae and EOM are normal Nose: without d/c or deformity Neck: Neck supple. Gross normal ROM Cardiovascular: Normal rate and regular rhythm.   Pulmonary/Chest: Effort normal and breath sounds without rales or wheezing.  Abd:  Soft, NT, ND, + BS, no organomegaly Neurological: Pt is alert. At baseline orientation, motor grossly intact Skin: Skin is warm. No rashes, other new lesions, no LE edema Psychiatric: Pt behavior is normal without agitation  All otherwise neg per pt Lab Results  Component Value Date   WBC 6.1 04/25/2019   HGB 13.9 04/25/2019   HCT 42.6 04/25/2019   PLT 221.0 04/25/2019   GLUCOSE 83 04/25/2019   CHOL 194 04/25/2019   TRIG 71.0 04/25/2019   HDL 60.00 04/25/2019   LDLCALC 120 (H) 04/25/2019   ALT 11 04/25/2019   AST 14 04/25/2019   NA 142 04/25/2019   K 4.0 04/25/2019   CL 111 04/25/2019   CREATININE 1.12 04/25/2019   BUN 18 04/25/2019   CO2 26 04/25/2019   TSH 0.68 04/25/2019   PSA 1.41 04/25/2019      Assessment & Plan:

## 2019-04-25 NOTE — Assessment & Plan Note (Signed)
stable overall by history and exam, recent data reviewed with pt, and pt to continue medical treatment as before,  to f/u any worsening symptoms or concerns  

## 2019-04-25 NOTE — Patient Instructions (Signed)
You are now on the [facility] Vaccine Wait List  Please take all new medication as prescribed - the flomax for the prostate  Please continue all other medications as before, and refills have been done if requested  Please have the pharmacy call with any other refills you may need.  Please continue your efforts at being more active, low cholesterol diet, and weight control.  You are otherwise up to date with prevention measures today.  Please keep your appointments with your specialists as you may have planned  Please go to the LAB at the blood drawing area for the tests to be done  You will be contacted by phone if any changes need to be made immediately.  Otherwise, you will receive a letter about your results with an explanation, but please check with MyChart first.  Please remember to sign up for MyChart if you have not done so, as this will be important to you in the future with finding out test results, communicating by private email, and scheduling acute appointments online when needed.  Please make an Appointment to return in 6 months, or sooner if needed

## 2019-04-25 NOTE — Assessment & Plan Note (Addendum)
Uncontrolled, for med restart, o/w stable overall by history and exam, recent data reviewed with pt, and pt to continue medical treatment as before,  to f/u any worsening symptoms or concerns  

## 2019-04-29 ENCOUNTER — Other Ambulatory Visit: Payer: Self-pay | Admitting: Internal Medicine

## 2019-04-29 ENCOUNTER — Encounter: Payer: Self-pay | Admitting: Internal Medicine

## 2019-04-29 MED ORDER — VITAMIN D (ERGOCALCIFEROL) 1.25 MG (50000 UNIT) PO CAPS: 50000.0000 [IU] | ORAL_CAPSULE | ORAL | 0 refills | Status: DC

## 2020-06-12 ENCOUNTER — Other Ambulatory Visit: Payer: Self-pay | Admitting: Internal Medicine

## 2020-06-12 NOTE — Telephone Encounter (Signed)
Ok to contact pt   meds refilled today for 1 mo  Please to make ROV for further refills

## 2020-06-26 ENCOUNTER — Other Ambulatory Visit: Payer: Self-pay | Admitting: Internal Medicine

## 2020-06-26 NOTE — Telephone Encounter (Signed)
Ok to let pt know  Several meds were refilled today for 1 mo  Please make rov for further refills, as these would be the last refills based on our office refill policy  Pt last seen feb 2021

## 2020-07-25 ENCOUNTER — Other Ambulatory Visit: Payer: Self-pay | Admitting: Internal Medicine

## 2020-09-29 ENCOUNTER — Encounter: Payer: Self-pay | Admitting: Internal Medicine

## 2020-09-29 ENCOUNTER — Other Ambulatory Visit: Payer: Self-pay

## 2020-09-29 ENCOUNTER — Ambulatory Visit (INDEPENDENT_AMBULATORY_CARE_PROVIDER_SITE_OTHER): Payer: Medicare HMO | Admitting: Internal Medicine

## 2020-09-29 VITALS — BP 160/98 | HR 62 | Temp 98.4°F | Ht 71.0 in | Wt 158.2 lb

## 2020-09-29 DIAGNOSIS — M25519 Pain in unspecified shoulder: Secondary | ICD-10-CM | POA: Insufficient documentation

## 2020-09-29 DIAGNOSIS — F17200 Nicotine dependence, unspecified, uncomplicated: Secondary | ICD-10-CM

## 2020-09-29 DIAGNOSIS — E785 Hyperlipidemia, unspecified: Secondary | ICD-10-CM | POA: Diagnosis not present

## 2020-09-29 DIAGNOSIS — I700 Atherosclerosis of aorta: Secondary | ICD-10-CM | POA: Insufficient documentation

## 2020-09-29 DIAGNOSIS — R109 Unspecified abdominal pain: Secondary | ICD-10-CM | POA: Insufficient documentation

## 2020-09-29 DIAGNOSIS — E611 Iron deficiency: Secondary | ICD-10-CM

## 2020-09-29 DIAGNOSIS — E538 Deficiency of other specified B group vitamins: Secondary | ICD-10-CM

## 2020-09-29 DIAGNOSIS — S22009A Unspecified fracture of unspecified thoracic vertebra, initial encounter for closed fracture: Secondary | ICD-10-CM | POA: Insufficient documentation

## 2020-09-29 DIAGNOSIS — E559 Vitamin D deficiency, unspecified: Secondary | ICD-10-CM | POA: Insufficient documentation

## 2020-09-29 DIAGNOSIS — I10 Essential (primary) hypertension: Secondary | ICD-10-CM | POA: Diagnosis not present

## 2020-09-29 DIAGNOSIS — R059 Cough, unspecified: Secondary | ICD-10-CM

## 2020-09-29 DIAGNOSIS — Z0001 Encounter for general adult medical examination with abnormal findings: Secondary | ICD-10-CM

## 2020-09-29 DIAGNOSIS — K5900 Constipation, unspecified: Secondary | ICD-10-CM | POA: Insufficient documentation

## 2020-09-29 DIAGNOSIS — R69 Illness, unspecified: Secondary | ICD-10-CM | POA: Diagnosis not present

## 2020-09-29 MED ORDER — TAMSULOSIN HCL 0.4 MG PO CAPS: 0.4000 mg | ORAL_CAPSULE | Freq: Every day | ORAL | 3 refills | Status: DC

## 2020-09-29 MED ORDER — CITALOPRAM HYDROBROMIDE 10 MG PO TABS: 10.0000 mg | ORAL_TABLET | Freq: Every day | ORAL | 3 refills | Status: DC

## 2020-09-29 MED ORDER — CHOLECALCIFEROL 50 MCG (2000 UT) PO TABS: ORAL_TABLET | ORAL | 99 refills | Status: AC

## 2020-09-29 MED ORDER — ROSUVASTATIN CALCIUM 40 MG PO TABS: 40.0000 mg | ORAL_TABLET | Freq: Every day | ORAL | 3 refills | Status: DC

## 2020-09-29 MED ORDER — LOSARTAN POTASSIUM 100 MG PO TABS: 100.0000 mg | ORAL_TABLET | Freq: Every day | ORAL | 3 refills | Status: DC

## 2020-09-29 NOTE — Patient Instructions (Addendum)
Please take OTC Vitamin D3 at 2000 units per day, indefinitely  Please stop smoking  Please consider the CT cardiac score test I mentioned to see if you have blockages in the arteries of your heart  Please continue all other medications as before, and refills have been done if requested.  Please have the pharmacy call with any other refills you may need.  Please continue your efforts at being more active, low cholesterol diet, and weight control.  You are otherwise up to date with prevention measures today.  Please keep your appointments with your specialists as you may have planned  Please go to the XRAY Department in the first floor for the x-ray testing  Please go to the LAB at the blood drawing area for the tests to be done  You will be contacted by phone if any changes need to be made immediately.  Otherwise, you will receive a letter about your results with an explanation, but please check with MyChart first.  Please remember to sign up for MyChart if you have not done so, as this will be important to you in the future with finding out test results, communicating by private email, and scheduling acute appointments online when needed.  Please make an Appointment to return in 6 months, or sooner if needed

## 2020-09-29 NOTE — Assessment & Plan Note (Signed)
Last vitamin D Lab Results  Component Value Date   VD25OH 10.94 (L) 04/25/2019   LOw to start oral replacement

## 2020-09-29 NOTE — Progress Notes (Signed)
Patient ID: Jesse Williams, male   DOB: [birth year], 70 y.o.   MRN: [id number]         Chief Complaint:: wellness exam and uncontrolled htn, smoking, hld, low vit d       HPI:  CHIRSTOPHER Williams is a 70 y.o. male here for wellness exam; still smoking several cigs per day, not ready to quit.  Declines pulmonary referral for LDCT testing.  Also declines colonoscpy, shingrix, tdap, pneumvax.  O/w up to date with preventive referrals and immunizations.                        Also did have episode of heat illness workingin the sun about 2 wk ago, had weakness, had spots dark in the visioin for 45 min, but resolved with cool air and hydration;  not taking vit d.  Trying to follow lower chol diet. Still smoking, not ready to quit.  Has been out of BP meds for several weeks, but willing to restart.  Pt denies chest pain, increased sob or doe, wheezing, orthopnea, PND, increased LE swelling, palpitations, dizziness or syncope.   Pt denies polydipsia, polyuria, or new focal neuro s/s.   Pt denies fever, wt loss, night sweats, loss of appetite, or other constitutional symptoms  no other new complaints  Wt Readings from Last 3 Encounters:  09/29/20 158 lb 3.2 oz (71.8 kg)  04/25/19 173 lb (78.5 kg)  12/19/17 160 lb (72.6 kg)   BP Readings from Last 3 Encounters:  09/29/20 (!) 160/98  04/25/19 (!) 170/110  12/19/17 138/86   Immunization History  Administered Date(s) Administered   Influenza-Unspecified 03/17/2010   PFIZER Comirnaty(Gray Top)Covid-19 Tri-Sucrose Vaccine 10/24/2019, 11/15/2019   There are no preventive care reminders to display for this patient.     Past Medical History:  Diagnosis Date   HLD (hyperlipidemia) 03/30/2017   Hypertension    History reviewed. No pertinent surgical history.  reports that he has been smoking. He has never used smokeless tobacco. He reports that he does not drink alcohol. No history on file for drug use. family history is not on file. No Known  Allergies Current Outpatient Medications on File Prior to Visit  Medication Sig Dispense Refill   polyethylene glycol powder (GLYCOLAX/MIRALAX) powder Take 17 g by mouth 2 (two) times daily as needed. 289 g 1   No current facility-administered medications on file prior to visit.        ROS:  All others reviewed and negative.  Objective        PE:  BP (!) 160/98 (BP Location: Left Arm, Patient Position: Sitting, Cuff Size: Normal)   Pulse 62   Temp 98.4 F (36.9 C) (Oral)   Ht 5\' 11"  (1.803 m)   Wt 158 lb 3.2 oz (71.8 kg)   SpO2 97%   BMI 22.06 kg/m                 Constitutional: Pt appears in NAD               HENT: Head: NCAT.                Right Ear: External ear normal.                 Left Ear: External ear normal.                Eyes: . Pupils are equal, round, and reactive to light. Conjunctivae and EOM  are normal               Nose: without d/c or deformity               Neck: Neck supple. Gross normal ROM               Cardiovascular: Normal rate and regular rhythm.                 Pulmonary/Chest: Effort normal and breath sounds without rales or wheezing.                Abd:  Soft, NT, ND, + BS, no organomegaly               Neurological: Pt is alert. At baseline orientation, motor grossly intact               Skin: Skin is warm. No rashes, no other new lesions, LE edema - none               Psychiatric: Pt behavior is normal without agitation   Micro: none  Cardiac tracings I have personally interpreted today:  none  Pertinent Radiological findings (summarize): none   Lab Results  Component Value Date   WBC 6.1 04/25/2019   HGB 13.9 04/25/2019   HCT 42.6 04/25/2019   PLT 221.0 04/25/2019   GLUCOSE 83 04/25/2019   CHOL 194 04/25/2019   TRIG 71.0 04/25/2019   HDL 60.00 04/25/2019   LDLCALC 120 (H) 04/25/2019   ALT 11 04/25/2019   AST 14 04/25/2019   NA 142 04/25/2019   K 4.0 04/25/2019   CL 111 04/25/2019   CREATININE 1.12 04/25/2019   BUN 18  04/25/2019   CO2 26 04/25/2019   TSH 0.68 04/25/2019   PSA 1.41 04/25/2019   Assessment/Plan:  Jesse Williams is a 70 y.o. Black or African American [2] male with  has a past medical history of HLD (hyperlipidemia) (03/30/2017) and Hypertension.  Vitamin D deficiency Last vitamin D Lab Results  Component Value Date   VD25OH 10.94 (L) 04/25/2019   LOw to start oral replacement   Encounter for well adult exam with abnormal findings Age and sex appropriate education and counseling updated with regular exercise and diet Referrals for preventative services - decilnes LDCT, colonoscopy Immunizations addressed - declines shingrix, tdap, pneumovax Smoking counseling  - counseled to quit, pt not ready Evidence for depression or other mood disorder - none significant Most recent labs reviewed. I have personally reviewed and have noted: 1) the patient's medical and social history 2) The patient's current medications and supplements 3) The patient's height, weight, and BMI have been recorded in the chart   Aortic atherosclerosis (HCC) Pt to continue low chol diet, crestor, excercise  HLD (hyperlipidemia) Lab Results  Component Value Date   LDLCALC 120 (H) 04/25/2019   uncontrolled, pt to increase current statin crestor 40 mg; consider check cardiac CT score  Hypertension, uncontrolled BP Readings from Last 3 Encounters:  09/29/20 (!) 160/98  04/25/19 (!) 170/110  12/19/17 138/86   Uncontrolled out of medicaiton, pt to restart medical treatment losartan   TOBACCO USE Pt counseled to quit, pt not ready; does request cxr today howevere  Followup: Return in about 6 months (around 04/01/2021).  Jesse Barre, MD 10/04/2020 12:53 PM [facility]

## 2020-10-04 ENCOUNTER — Encounter: Payer: Self-pay | Admitting: Internal Medicine

## 2020-10-04 NOTE — Assessment & Plan Note (Addendum)
Pt to continue low chol diet, crestor, excercise

## 2020-10-04 NOTE — Assessment & Plan Note (Signed)
BP Readings from Last 3 Encounters:  09/29/20 (!) 160/98  04/25/19 (!) 170/110  12/19/17 138/86   Uncontrolled out of medicaiton, pt to restart medical treatment losartan

## 2020-10-04 NOTE — Assessment & Plan Note (Addendum)
Lab Results  Component Value Date   LDLCALC 120 (H) 04/25/2019   uncontrolled, pt to increase current statin crestor 40 mg; consider check cardiac CT score

## 2020-10-04 NOTE — Assessment & Plan Note (Signed)
Age and sex appropriate education and counseling updated with regular exercise and diet Referrals for preventative services - decilnes LDCT, colonoscopy Immunizations addressed - declines shingrix, tdap, pneumovax Smoking counseling  - counseled to quit, pt not ready Evidence for depression or other mood disorder - none significant Most recent labs reviewed. I have personally reviewed and have noted: 1) the patient's medical and social history 2) The patient's current medications and supplements 3) The patient's height, weight, and BMI have been recorded in the chart

## 2020-10-04 NOTE — Assessment & Plan Note (Addendum)
Pt counseled to quit, pt not ready; does request cxr today howevere

## 2020-12-09 ENCOUNTER — Ambulatory Visit: Payer: Medicare HMO

## 2020-12-14 ENCOUNTER — Ambulatory Visit (INDEPENDENT_AMBULATORY_CARE_PROVIDER_SITE_OTHER): Payer: Medicare HMO

## 2020-12-14 DIAGNOSIS — Z1211 Encounter for screening for malignant neoplasm of colon: Secondary | ICD-10-CM

## 2020-12-14 DIAGNOSIS — Z0000 Encounter for general adult medical examination without abnormal findings: Secondary | ICD-10-CM

## 2020-12-14 NOTE — Patient Instructions (Signed)
Jesse Williams , Thank you for taking time to come for your Medicare Wellness Visit. I appreciate your ongoing commitment to your health goals. Please review the following plan we discussed and let me know if I can assist you in the future.   Screening recommendations/referrals: Colonoscopy: last done 11/26/2009; due every 10 years; referral placed to GI: Dr. Claudette Head Recommended yearly ophthalmology/optometry visit for glaucoma screening and checkup Recommended yearly dental visit for hygiene and checkup  Vaccinations: Influenza vaccine: declined Pneumococcal vaccine: declined Tdap vaccine: declined Shingles vaccine: declined   Covid-19: 10/24/2019, 11/15/2019  Advanced directives: Advance directive discussed with you today. Even though you declined this today please call our office should you change your mind and we can give you the proper paperwork for you to fill out.  Conditions/risks identified: Yes; Client understands the importance of follow-up with providers by attending scheduled visits and discussed goals to eat healthier, increase physical activity, exercise the brain, socialize more, get enough sleep and make time for laughter.  Next appointment: Please schedule your next Medicare Wellness Visit with your Nurse Health Advisor in 1 year by calling [phone].  Preventive Care 72 Years and Older, Male Preventive care refers to lifestyle choices and visits with your health care provider that can promote health and wellness. What does preventive care include? A yearly physical exam. This is also called an annual well check. Dental exams once or twice a year. Routine eye exams. Ask your health care provider how often you should have your eyes checked. Personal lifestyle choices, including: Daily care of your teeth and gums. Regular physical activity. Eating a healthy diet. Avoiding tobacco and drug use. Limiting alcohol use. Practicing safe sex. Taking low doses of aspirin  every day. Taking vitamin and mineral supplements as recommended by your health care provider. What happens during an annual well check? The services and screenings done by your health care provider during your annual well check will depend on your age, overall health, lifestyle risk factors, and family history of disease. Counseling  Your health care provider may ask you questions about your: Alcohol use. Tobacco use. Drug use. Emotional well-being. Home and relationship well-being. Sexual activity. Eating habits. History of falls. Memory and ability to understand (cognition). Work and work Astronomer. Screening  You may have the following tests or measurements: Height, weight, and BMI. Blood pressure. Lipid and cholesterol levels. These may be checked every 5 years, or more frequently if you are over 55 years old. Skin check. Lung cancer screening. You may have this screening every year starting at age 104 if you have a 30-pack-year history of smoking and currently smoke or have quit within the past 15 years. Fecal occult blood test (FOBT) of the stool. You may have this test every year starting at age 69. Flexible sigmoidoscopy or colonoscopy. You may have a sigmoidoscopy every 5 years or a colonoscopy every 10 years starting at age 31. Prostate cancer screening. Recommendations will vary depending on your family history and other risks. Hepatitis C blood test. Hepatitis B blood test. Sexually transmitted disease (STD) testing. Diabetes screening. This is done by checking your blood sugar (glucose) after you have not eaten for a while (fasting). You may have this done every 1-3 years. Abdominal aortic aneurysm (AAA) screening. You may need this if you are a current or former smoker. Osteoporosis. You may be screened starting at age 66 if you are at high risk. Talk with your health care provider about your test results, treatment options,  and if necessary, the need for more  tests. Vaccines  Your health care provider may recommend certain vaccines, such as: Influenza vaccine. This is recommended every year. Tetanus, diphtheria, and acellular pertussis (Tdap, Td) vaccine. You may need a Td booster every 10 years. Zoster vaccine. You may need this after age 47. Pneumococcal 13-valent conjugate (PCV13) vaccine. One dose is recommended after age 23. Pneumococcal polysaccharide (PPSV23) vaccine. One dose is recommended after age 41. Talk to your health care provider about which screenings and vaccines you need and how often you need them. This information is not intended to replace advice given to you by your health care provider. Make sure you discuss any questions you have with your health care provider. Document Released: 04/03/2015 Document Revised: 11/25/2015 Document Reviewed: 01/06/2015 Elsevier Interactive Patient Education  2017 [place] Prevention in the Home Falls can cause injuries. They can happen to people of all ages. There are many things you can do to make your home safe and to help prevent falls. What can I do on the outside of my home? Regularly fix the edges of walkways and driveways and fix any cracks. Remove anything that might make you trip as you walk through a door, such as a raised step or threshold. Trim any bushes or trees on the path to your home. Use bright outdoor lighting. Clear any walking paths of anything that might make someone trip, such as rocks or tools. Regularly check to see if handrails are loose or broken. Make sure that both sides of any steps have handrails. Any raised decks and porches should have guardrails on the edges. Have any leaves, snow, or ice cleared regularly. Use sand or salt on walking paths during winter. Clean up any spills in your garage right away. This includes oil or grease spills. What can I do in the bathroom? Use night lights. Install grab bars by the toilet and in the tub and shower.  Do not use towel bars as grab bars. Use non-skid mats or decals in the tub or shower. If you need to sit down in the shower, use a plastic, non-slip stool. Keep the floor dry. Clean up any water that spills on the floor as soon as it happens. Remove soap buildup in the tub or shower regularly. Attach bath mats securely with double-sided non-slip rug tape. Do not have throw rugs and other things on the floor that can make you trip. What can I do in the bedroom? Use night lights. Make sure that you have a light by your bed that is easy to reach. Do not use any sheets or blankets that are too big for your bed. They should not hang down onto the floor. Have a firm chair that has side arms. You can use this for support while you get dressed. Do not have throw rugs and other things on the floor that can make you trip. What can I do in the kitchen? Clean up any spills right away. Avoid walking on wet floors. Keep items that you use a lot in easy-to-reach places. If you need to reach something above you, use a strong step stool that has a grab bar. Keep electrical cords out of the way. Do not use floor polish or wax that makes floors slippery. If you must use wax, use non-skid floor wax. Do not have throw rugs and other things on the floor that can make you trip. What can I do with my stairs? Do not leave  any items on the stairs. Make sure that there are handrails on both sides of the stairs and use them. Fix handrails that are broken or loose. Make sure that handrails are as long as the stairways. Check any carpeting to make sure that it is firmly attached to the stairs. Fix any carpet that is loose or worn. Avoid having throw rugs at the top or bottom of the stairs. If you do have throw rugs, attach them to the floor with carpet tape. Make sure that you have a light switch at the top of the stairs and the bottom of the stairs. If you do not have them, ask someone to add them for you. What else  can I do to help prevent falls? Wear shoes that: Do not have high heels. Have rubber bottoms. Are comfortable and fit you well. Are closed at the toe. Do not wear sandals. If you use a stepladder: Make sure that it is fully opened. Do not climb a closed stepladder. Make sure that both sides of the stepladder are locked into place. Ask someone to hold it for you, if possible. Clearly mark and make sure that you can see: Any grab bars or handrails. First and last steps. Where the edge of each step is. Use tools that help you move around (mobility aids) if they are needed. These include: Canes. Walkers. Scooters. Crutches. Turn on the lights when you go into a dark area. Replace any light bulbs as soon as they burn out. Set up your furniture so you have a clear path. Avoid moving your furniture around. If any of your floors are uneven, fix them. If there are any pets around you, be aware of where they are. Review your medicines with your doctor. Some medicines can make you feel dizzy. This can increase your chance of falling. Ask your doctor what other things that you can do to help prevent falls. This information is not intended to replace advice given to you by your health care provider. Make sure you discuss any questions you have with your health care provider. Document Released: 01/01/2009 Document Revised: 08/13/2015 Document Reviewed: 04/11/2014 Elsevier Interactive Patient Education  2017 [employer].

## 2020-12-14 NOTE — Progress Notes (Signed)
Patient ID: Jesse Williams, male   DOB: February 26, 1951, 70 y.o.   MRN: [id number] Medical screening examination/treatment/procedure(s) were performed by non-physician practitioner and as supervising physician I was immediately available for consultation/collaboration.  I agree with above. Oliver Barre, MD

## 2020-12-14 NOTE — Progress Notes (Addendum)
I connected with Jesse Williams today by telephone and verified that I am speaking with the correct person using two identifiers. Location patient: home Location provider: work Persons participating in the virtual visit: patient, provider.   I discussed the limitations, risks, security and privacy concerns of performing an evaluation and management service by telephone and the availability of in person appointments. I also discussed with the patient that there may be a patient responsible charge related to this service. The patient expressed understanding and verbally consented to this telephonic visit.    Interactive audio and video telecommunications were attempted between this provider and patient, however failed, due to patient having technical difficulties OR patient did not have access to video capability.  We continued and completed visit with audio only.  Some vital signs may be absent or patient reported.   Time Spent with patient on telephone encounter: 40 minutes  Subjective:   Jesse Williams is a 70 y.o. male who presents for an Initial Medicare Annual Wellness Visit.  Review of Systems     Cardiac Risk Factors include: advanced age (>45men, >48 women);hypertension;male gender;dyslipidemia;smoking/ tobacco exposure     Objective:    There were no vitals filed for this visit. There is no height or weight on file to calculate BMI.  Advanced Directives 12/14/2020  Does Patient Have a Medical Advance Directive? No  Would patient like information on creating a medical advance directive? No - Patient declined    Current Medications (verified) Outpatient Encounter Medications as of 12/14/2020  Medication Sig   Cholecalciferol 50 MCG (2000 UT) TABS 1 tab by mouth once daily   citalopram (CELEXA) 10 MG tablet Take 1 tablet (10 mg total) by mouth daily.   losartan (COZAAR) 100 MG tablet Take 1 tablet (100 mg total) by mouth daily.   polyethylene glycol powder (GLYCOLAX/MIRALAX)  powder Take 17 g by mouth 2 (two) times daily as needed.   rosuvastatin (CRESTOR) 40 MG tablet Take 1 tablet (40 mg total) by mouth daily.   tamsulosin (FLOMAX) 0.4 MG CAPS capsule Take 1 capsule (0.4 mg total) by mouth daily.   No facility-administered encounter medications on file as of 12/14/2020.    Allergies (verified) Patient has no known allergies.   History: Past Medical History:  Diagnosis Date   HLD (hyperlipidemia) 03/30/2017   Hypertension    History reviewed. No pertinent surgical history. History reviewed. No pertinent family history. Social History   Socioeconomic History   Marital status: Single    Spouse name: Not on file   Number of children: Not on file   Years of education: Not on file   Highest education level: Not on file  Occupational History   Not on file  Tobacco Use   Smoking status: Every Day   Smokeless tobacco: Never  Substance and Sexual Activity   Alcohol use: No    Comment: social use   Drug use: Not on file   Sexual activity: Not on file  Other Topics Concern   Not on file  Social History Narrative   Not on file   Social Determinants of Health   Financial Resource Strain: Low Risk    Difficulty of Paying Living Expenses: Not hard at all  Food Insecurity: No Food Insecurity   Worried About Running Out of Food in the Last Year: Never true   Ran Out of Food in the Last Year: Never true  Transportation Needs: No Transportation Needs   Lack of Transportation (Medical): No  Lack of Transportation (Non-Medical): No  Physical Activity: Sufficiently Active   Days of Exercise per Week: 5 days   Minutes of Exercise per Session: 30 min  Stress: No Stress Concern Present   Feeling of Stress : Not at all  Social Connections: Socially Isolated   Frequency of Communication with Friends and Family: More than three times a week   Frequency of Social Gatherings with Friends and Family: More than three times a week   Attends Religious Services:  Never   Database administrator or Organizations: No   Attends Engineer, structural: Never   Marital Status: Never married    Tobacco Counseling Ready to quit: Not Answered Counseling given: Not Answered   Clinical Intake:  Pre-visit preparation completed: Yes  Pain : No/denies pain     Nutritional Risks: None Diabetes: No  How often do you need to have someone help you when you read instructions, pamphlets, or other written materials from your doctor or pharmacy?: 1 - Never What is the last grade level you completed in school?: High School Graduate  Diabetic? no  Interpreter Needed?: No  Information entered by :: Susie Cassette, LPN   Activities of Daily Living In your present state of health, do you have any difficulty performing the following activities: 12/14/2020 09/29/2020  Hearing? N N  Vision? N N  Difficulty concentrating or making decisions? N N  Walking or climbing stairs? N N  Dressing or bathing? N N  Doing errands, shopping? N N  Preparing Food and eating ? N -  Using the Toilet? N -  In the past six months, have you accidently leaked urine? N -  Do you have problems with loss of bowel control? N -  Managing your Medications? N -  Managing your Finances? N -  Housekeeping or managing your Housekeeping? N -  Some recent data might be hidden    Patient Care Team: Corwin Levins, MD as PCP - General (Internal Medicine)  Indicate any recent Medical Services you may have received from other than [facility] providers in the past year (date may be approximate).     Assessment:   This is a routine wellness examination for Jesse Williams.  Hearing/Vision screen Hearing Screening - Comments:: Patient denied any hearing difficulty.   No hearing aids.  Vision Screening - Comments:: Patient does not wear corrective glasses/contacts.    Dietary issues and exercise activities discussed: Current Exercise Habits: Home exercise routine, Type of exercise: walking,  Time (Minutes): 30, Frequency (Times/Week): 5, Weekly Exercise (Minutes/Week): 150, Intensity: Moderate, Exercise limited by: None identified   Goals Addressed   None   Depression Screen PHQ 2/9 Scores 12/14/2020 09/29/2020 04/25/2019 03/17/2017  PHQ - 2 Score 0 0 1 0    Fall Risk Fall Risk  12/14/2020 09/29/2020 09/29/2020 04/25/2019 03/17/2017  Falls in the past year? 0 1 0 0 No  Number falls in past yr: 0 0 0 - -  Injury with Fall? 0 0 0 - -  Risk for fall due to : No Fall Risks - - - -  Follow up Falls evaluation completed - - - -    FALL RISK PREVENTION PERTAINING TO THE HOME:  Any stairs in or around the home? No  If so, are there any without handrails? No  Home free of loose throw rugs in walkways, pet beds, electrical cords, etc? Yes  Adequate lighting in your home to reduce risk of falls? Yes   ASSISTIVE DEVICES UTILIZED  TO PREVENT FALLS:  Life alert? No  Use of a cane, walker or w/c? No  Grab bars in the bathroom? No  Shower chair or bench in shower? No  Elevated toilet seat or a handicapped toilet? No   TIMED UP AND GO:  Was the test performed? No .  Length of time to ambulate 10 feet: n/a sec.   Gait steady and fast without use of assistive device  Cognitive Function: Normal cognitive status assessed by direct observation by this Nurse Health Advisor. No abnormalities found.          Immunizations Immunization History  Administered Date(s) Administered   Influenza-Unspecified 03/17/2010   PFIZER Comirnaty(Gray Top)Covid-19 Tri-Sucrose Vaccine 10/24/2019, 11/15/2019    TDAP status: Due, Education has been provided regarding the importance of this vaccine. Advised may receive this vaccine at local pharmacy or Health Dept. Aware to provide a copy of the vaccination record if obtained from local pharmacy or Health Dept. Verbalized acceptance and understanding.  Flu Vaccine status: Declined, Education has been provided regarding the importance of this vaccine but  patient still declined. Advised may receive this vaccine at local pharmacy or Health Dept. Aware to provide a copy of the vaccination record if obtained from local pharmacy or Health Dept. Verbalized acceptance and understanding.  Pneumococcal vaccine status: Declined,  Education has been provided regarding the importance of this vaccine but patient still declined. Advised may receive this vaccine at local pharmacy or Health Dept. Aware to provide a copy of the vaccination record if obtained from local pharmacy or Health Dept. Verbalized acceptance and understanding.   Covid-19 vaccine status: Completed vaccines  Qualifies for Shingles Vaccine? Yes   Zostavax completed No   Shingrix Completed?: No.    Education has been provided regarding the importance of this vaccine. Patient has been advised to call insurance company to determine out of pocket expense if they have not yet received this vaccine. Advised may also receive vaccine at local pharmacy or Health Dept. Verbalized acceptance and understanding.  Screening Tests Health Maintenance  Topic Date Due   INFLUENZA VACCINE  10/19/2020   Zoster Vaccines- Shingrix (1 of 2) 12/30/2020 (Originally 07/15/1998)   COLONOSCOPY (Pts 45-74yrs Insurance coverage will need to be confirmed)  09/29/2021 (Originally 11/27/2019)   TETANUS/TDAP  09/29/2021 (Originally 07/15/1967)   Hepatitis C Screening  Completed   HPV VACCINES  Aged Out   COVID-19 Vaccine  Discontinued    Health Maintenance  Health Maintenance Due  Topic Date Due   INFLUENZA VACCINE  10/19/2020    Colorectal cancer screening: Referral to GI placed 12/14/2020. Pt aware the office will call re: appt.  Lung Cancer Screening: (Low Dose CT Chest recommended if Age 41-80 years, 30 pack-year currently smoking OR have quit w/in 15years.) does qualify.   Lung Cancer Screening Referral: no  Additional Screening:  Hepatitis C Screening: does qualify; Completed yes  Vision Screening:  Recommended annual ophthalmology exams for early detection of glaucoma and other disorders of the eye. Is the patient up to date with their annual eye exam?  No  Who is the provider or what is the name of the office in which the patient attends annual eye exams? Patient refused  If pt is not established with a provider, would they like to be referred to a provider to establish care? No .   Dental Screening: Recommended annual dental exams for proper oral hygiene  Community Resource Referral / Chronic Care Management: CRR required this visit?  No  CCM required this visit?  No      Plan:     I have personally reviewed and noted the following in the patient's chart:   Medical and social history Use of alcohol, tobacco or illicit drugs  Current medications and supplements including opioid prescriptions. Patient is not currently taking opioid prescriptions. Functional ability and status Nutritional status Physical activity Advanced directives List of other physicians Hospitalizations, surgeries, and ER visits in previous 12 months Vitals Screenings to include cognitive, depression, and falls Referrals and appointments  In addition, I have reviewed and discussed with patient certain preventive protocols, quality metrics, and best practice recommendations. A written personalized care plan for preventive services as well as general preventive health recommendations were provided to patient.     Mickeal Needy, LPN   8/93/[id number]   Nurse Notes:  Patient is cogitatively intact. There were no vitals filed for this visit. There is no height or weight on file to calculate BMI. Patient stated that he has no issues with gait or balance; does not use any assistive devices. Hearing Screening - Comments:: Patient denied any hearing difficulty.   No hearing aids.  Vision Screening - Comments:: Patient does not wear corrective glasses/contacts.        Medical screening  examination/treatment/procedure(s) were performed by non-physician practitioner and as supervising physician I was immediately available for consultation/collaboration.  I agree with above. Oliver Barre, MD

## 2021-04-05 ENCOUNTER — Ambulatory Visit: Payer: Medicare HMO | Admitting: Internal Medicine

## 2021-04-20 ENCOUNTER — Encounter: Payer: Self-pay | Admitting: Internal Medicine

## 2021-04-26 ENCOUNTER — Other Ambulatory Visit: Payer: Self-pay

## 2021-04-26 ENCOUNTER — Other Ambulatory Visit: Payer: Self-pay | Admitting: Internal Medicine

## 2021-04-26 ENCOUNTER — Encounter: Payer: Self-pay | Admitting: Internal Medicine

## 2021-04-26 ENCOUNTER — Ambulatory Visit (INDEPENDENT_AMBULATORY_CARE_PROVIDER_SITE_OTHER): Payer: Medicare HMO | Admitting: Internal Medicine

## 2021-04-26 VITALS — BP 160/90 | HR 68 | Temp 98.9°F | Ht 71.0 in | Wt 168.0 lb

## 2021-04-26 DIAGNOSIS — Z0001 Encounter for general adult medical examination with abnormal findings: Secondary | ICD-10-CM | POA: Diagnosis not present

## 2021-04-26 DIAGNOSIS — E559 Vitamin D deficiency, unspecified: Secondary | ICD-10-CM | POA: Diagnosis not present

## 2021-04-26 DIAGNOSIS — R059 Cough, unspecified: Secondary | ICD-10-CM | POA: Diagnosis not present

## 2021-04-26 DIAGNOSIS — N138 Other obstructive and reflux uropathy: Secondary | ICD-10-CM

## 2021-04-26 DIAGNOSIS — R69 Illness, unspecified: Secondary | ICD-10-CM | POA: Diagnosis not present

## 2021-04-26 DIAGNOSIS — N3281 Overactive bladder: Secondary | ICD-10-CM | POA: Diagnosis not present

## 2021-04-26 DIAGNOSIS — E538 Deficiency of other specified B group vitamins: Secondary | ICD-10-CM

## 2021-04-26 DIAGNOSIS — N401 Enlarged prostate with lower urinary tract symptoms: Secondary | ICD-10-CM | POA: Diagnosis not present

## 2021-04-26 DIAGNOSIS — I700 Atherosclerosis of aorta: Secondary | ICD-10-CM

## 2021-04-26 DIAGNOSIS — I10 Essential (primary) hypertension: Secondary | ICD-10-CM

## 2021-04-26 DIAGNOSIS — E7800 Pure hypercholesterolemia, unspecified: Secondary | ICD-10-CM

## 2021-04-26 DIAGNOSIS — F17200 Nicotine dependence, unspecified, uncomplicated: Secondary | ICD-10-CM

## 2021-04-26 DIAGNOSIS — F32A Depression, unspecified: Secondary | ICD-10-CM

## 2021-04-26 MED ORDER — AMLODIPINE BESYLATE 10 MG PO TABS: 10.0000 mg | ORAL_TABLET | Freq: Every day | ORAL | 3 refills | Status: DC

## 2021-04-26 MED ORDER — SOLIFENACIN SUCCINATE 5 MG PO TABS: 5.0000 mg | ORAL_TABLET | Freq: Every day | ORAL | 3 refills | Status: DC

## 2021-04-26 NOTE — Assessment & Plan Note (Signed)
Stable per pt, to continue the celexa 40 qd

## 2021-04-26 NOTE — Progress Notes (Signed)
Patient ID: Jesse Williams, male   DOB: 12/07/1950, 71 y.o.   MRN: [id number]         Chief Complaint:: wellness exam and Follow-up BP, low Vit D and B12, recent cough and urinary frequency       HPI:  Jesse Williams is a 71 y.o. male here for wellness exam; declines flu shot, shingrix, tdap, pneumovax and colonoscopy but o/w up to date.                          Also still smoking, not ready to quit.  Does have recent 1-2 wks onset annoying scant prod cough worse in the AM but o/w Pt denies chest pain, increased sob or doe, wheezing, orthopnea, PND, increased LE swelling, palpitations, dizziness or syncope.  Does not check BP at home .  Not taking Vit D or B12.  Denies urinary symptoms such as dysuria, urgency, flank pain, hematuria or n/v, fever, chills but does have worsening urinary frequency day and nocturia as well in the past 2 months.  States good compliance with meds and "I just take all you give me." And is not actually sure he is taking all that he is prescribed by name.  Denies worsening depressive symptoms, suicidal ideation, or panic   Wt Readings from Last 3 Encounters:  04/26/21 168 lb (76.2 kg)  09/29/20 158 lb 3.2 oz (71.8 kg)  04/25/19 173 lb (78.5 kg)   BP Readings from Last 3 Encounters:  04/26/21 (!) 160/90  09/29/20 (!) 160/98  04/25/19 (!) 170/110   Immunization History  Administered Date(s) Administered   Influenza-Unspecified 03/17/2010   PFIZER Comirnaty(Gray Top)Covid-19 Tri-Sucrose Vaccine 10/24/2019, 11/15/2019   There are no preventive care reminders to display for this patient.     Past Medical History:  Diagnosis Date   HLD (hyperlipidemia) 03/30/2017   Hypertension    History reviewed. No pertinent surgical history.  reports that he has been smoking. He has never used smokeless tobacco. He reports that he does not drink alcohol. No history on file for drug use. family history is not on file. No Known Allergies Current Outpatient Medications on  File Prior to Visit  Medication Sig Dispense Refill   Cholecalciferol 50 MCG (2000 UT) TABS 1 tab by mouth once daily 30 tablet 99   citalopram (CELEXA) 10 MG tablet Take 1 tablet (10 mg total) by mouth daily. 90 tablet 3   losartan (COZAAR) 100 MG tablet Take 1 tablet (100 mg total) by mouth daily. 90 tablet 3   polyethylene glycol powder (GLYCOLAX/MIRALAX) powder Take 17 g by mouth 2 (two) times daily as needed. 289 g 1   rosuvastatin (CRESTOR) 40 MG tablet Take 1 tablet (40 mg total) by mouth daily. 90 tablet 3   tamsulosin (FLOMAX) 0.4 MG CAPS capsule Take 1 capsule (0.4 mg total) by mouth daily. 90 capsule 3   No current facility-administered medications on file prior to visit.        ROS:  All others reviewed and negative.  Objective        PE:  BP (!) 160/90 (BP Location: Right Arm, Patient Position: Sitting, Cuff Size: Large)    Pulse 68    Temp 98.9 F (37.2 C) (Oral)    Ht 5\' 11"  (1.803 m)    Wt 168 lb (76.2 kg)    SpO2 98%    BMI 23.43 kg/m  Constitutional: Pt appears in NAD               HENT: Head: NCAT.                Right Ear: External ear normal.                 Left Ear: External ear normal.                Eyes: . Pupils are equal, round, and reactive to light. Conjunctivae and EOM are normal               Nose: without d/c or deformity               Neck: Neck supple. Gross normal ROM               Cardiovascular: Normal rate and regular rhythm.                 Pulmonary/Chest: Effort normal and breath sounds without rales or wheezing.                Abd:  Soft, NT, ND, + BS, no organomegaly               Neurological: Pt is alert. At baseline orientation, motor grossly intact               Skin: Skin is warm. No rashes, no other new lesions, LE edema - none but does have multiple large distal bilateral LE varocosities non thrombosed               Psychiatric: Pt behavior is normal without agitation   Micro: none  Cardiac tracings I have personally  interpreted today:  none  Pertinent Radiological findings (summarize): none   Lab Results  Component Value Date   WBC 6.1 04/25/2019   HGB 13.9 04/25/2019   HCT 42.6 04/25/2019   PLT 221.0 04/25/2019   GLUCOSE 83 04/25/2019   CHOL 194 04/25/2019   TRIG 71.0 04/25/2019   HDL 60.00 04/25/2019   LDLCALC 120 (H) 04/25/2019   ALT 11 04/25/2019   AST 14 04/25/2019   NA 142 04/25/2019   K 4.0 04/25/2019   CL 111 04/25/2019   CREATININE 1.12 04/25/2019   BUN 18 04/25/2019   CO2 26 04/25/2019   TSH 0.68 04/25/2019   PSA 1.41 04/25/2019   Assessment/Plan:  Jesse Williams is a 71 y.o. Black or African American [2] male with  has a past medical history of HLD (hyperlipidemia) (03/30/2017) and Hypertension.  Encounter for well adult exam with abnormal findings Age and sex appropriate education and counseling updated with regular exercise and diet Referrals for preventative services - declines colonoscopy Immunizations addressed - delcines flu shot, shingrix, tdap, pneumovax Smoking counseling  - counseled to quit, pt not ready Evidence for depression or other mood disorder - none significant Most recent labs reviewed. I have personally reviewed and have noted: 1) the patient's medical and social history 2) The patient's current medications and supplements 3) The patient's height, weight, and BMI have been recorded in the chart   Aortic atherosclerosis (HCC) Pt to continue crestor 40, low chol diet, excercise  BPH with obstruction/lower urinary tract symptoms Pt states stream is ok, will continue flomax  Cough etoilogy unclear,  ? Underlying copd - for cxr  Depression Stable per pt, to continue the celexa 40 qd  HLD (hyperlipidemia) Lab Results  Component Value Date   [facility]  120 (H) 04/25/2019   Uncontrolled, goal ldl < 70, pt to continue current statin crestor 40 and f/u lab today   Hypertension, uncontrolled BP Readings from Last 3 Encounters:  04/26/21 (!) 160/90   09/29/20 (!) 160/98  04/25/19 (!) 170/110   uncontrolled, pt to continue medical treatment losartan 100, and add amloidpine 10, f/u BP at home and next visit   OAB (overactive bladder) Most likely clinical dx, for UA with labs, but also vesicare 5 qd  TOBACCO USE Pt counseled to quit, pt not ready  Vitamin D deficiency Last vitamin D Lab Results  Component Value Date   VD25OH 10.94 (L) 04/25/2019   Low, to start oral replacement   B12 deficiency Lab Results  Component Value Date   VITAMINB12 238 04/25/2019   Low, to start oral replacement - b12 1000 mcg qd  Followup: Return in about 6 months (around 10/24/2021).  Cathlean Cower, MD 04/26/2021 11:40 AM [facility]

## 2021-04-26 NOTE — Assessment & Plan Note (Signed)
Last vitamin D Lab Results  Component Value Date   VD25OH 10.94 (L) 04/25/2019   Low, to start oral replacement  

## 2021-04-26 NOTE — Patient Instructions (Addendum)
Please take all new medication as prescribed - the amlodipine 10 mg per day for blood pressure  Please take all new medication as prescribed - the generic for Vesicare 5 mg per day for overactive bladder  Please take OTC Vitamin D3 at 2000 units per day, indefinitely (forever)  Please take all new medication as prescribed - the OTC B12 1000 mcg  - 1 per day for at least 6 months  Please continue all other medications as before, including all the medications you currently take now  Please have the pharmacy call with any other refills you may need.  Please continue your efforts at being more active, low cholesterol diet, and weight control.  You are otherwise up to date with prevention measures today.  Please keep your appointments with your specialists as you may have planned  Please go to the XRAY Department in the first floor for the x-ray testing  Please go to the LAB at the blood drawing area for the tests to be done  You will be contacted by phone if any changes need to be made immediately.  Otherwise, you will receive a letter about your results with an explanation, but please check with MyChart first.  Please remember to sign up for MyChart if you have not done so, as this will be important to you in the future with finding out test results, communicating by private email, and scheduling acute appointments online when needed.  Please make an Appointment to return in 6 months, or sooner if needed

## 2021-04-26 NOTE — Assessment & Plan Note (Signed)
Most likely clinical dx, for UA with labs, but also vesicare 5 qd

## 2021-04-26 NOTE — Assessment & Plan Note (Signed)
Age and sex appropriate education and counseling updated with regular exercise and diet Referrals for preventative services - declines colonoscopy Immunizations addressed - delcines flu shot, shingrix, tdap, pneumovax Smoking counseling  - counseled to quit, pt not ready Evidence for depression or other mood disorder - none significant Most recent labs reviewed. I have personally reviewed and have noted: 1) the patient's medical and social history 2) The patient's current medications and supplements 3) The patient's height, weight, and BMI have been recorded in the chart

## 2021-04-26 NOTE — Assessment & Plan Note (Signed)
Pt states stream is ok, will continue flomax

## 2021-04-26 NOTE — Assessment & Plan Note (Signed)
etoilogy unclear,  ? Underlying copd - for cxr

## 2021-04-26 NOTE — Assessment & Plan Note (Signed)
Pt counseled to quit, pt not ready 

## 2021-04-26 NOTE — Assessment & Plan Note (Signed)
BP Readings from Last 3 Encounters:  04/26/21 (!) 160/90  09/29/20 (!) 160/98  04/25/19 (!) 170/110   uncontrolled, pt to continue medical treatment losartan 100, and add amloidpine 10, f/u BP at home and next visit

## 2021-04-26 NOTE — Assessment & Plan Note (Signed)
Pt to continue crestor 40, low chol diet, excercise

## 2021-04-26 NOTE — Assessment & Plan Note (Signed)
Lab Results  Component Value Date   LDLCALC 120 (H) 04/25/2019   Uncontrolled, goal ldl < 70, pt to continue current statin crestor 40 and f/u lab today

## 2021-04-26 NOTE — Assessment & Plan Note (Signed)
Lab Results  Component Value Date   VITAMINB12 238 04/25/2019   Low, to start oral replacement - b12 1000 mcg qd

## 2021-04-27 ENCOUNTER — Ambulatory Visit: Payer: Medicare HMO | Admitting: Internal Medicine

## 2021-12-07 ENCOUNTER — Telehealth: Payer: Self-pay | Admitting: Internal Medicine

## 2021-12-07 NOTE — Telephone Encounter (Signed)
Lvm for pt to rtn my call to schedule AWV with [facility] call back # [phone] 

## 2022-04-05 ENCOUNTER — Ambulatory Visit: Payer: Medicare HMO | Admitting: Internal Medicine

## 2022-04-21 ENCOUNTER — Telehealth: Payer: Self-pay

## 2022-04-21 NOTE — Telephone Encounter (Signed)
N/A unable to leave a message for patient to call back to schedule Medicare Annual Wellness Visit   Last AWV  12/14/20  Please schedule at anytime with [facility] if patient calls the office back.    30 Minutes appointment   Any questions, please call me at [phone]

## 2022-04-26 ENCOUNTER — Encounter: Payer: Self-pay | Admitting: Internal Medicine

## 2022-04-26 ENCOUNTER — Ambulatory Visit (INDEPENDENT_AMBULATORY_CARE_PROVIDER_SITE_OTHER): Payer: Medicare HMO | Admitting: Internal Medicine

## 2022-04-26 VITALS — BP 142/90 | HR 79 | Temp 97.8°F | Ht 71.0 in | Wt 167.0 lb

## 2022-04-26 DIAGNOSIS — E538 Deficiency of other specified B group vitamins: Secondary | ICD-10-CM

## 2022-04-26 DIAGNOSIS — I10 Essential (primary) hypertension: Secondary | ICD-10-CM | POA: Diagnosis not present

## 2022-04-26 DIAGNOSIS — F17200 Nicotine dependence, unspecified, uncomplicated: Secondary | ICD-10-CM | POA: Diagnosis not present

## 2022-04-26 DIAGNOSIS — F32A Depression, unspecified: Secondary | ICD-10-CM | POA: Diagnosis not present

## 2022-04-26 DIAGNOSIS — E7800 Pure hypercholesterolemia, unspecified: Secondary | ICD-10-CM

## 2022-04-26 DIAGNOSIS — E559 Vitamin D deficiency, unspecified: Secondary | ICD-10-CM

## 2022-04-26 DIAGNOSIS — Z0001 Encounter for general adult medical examination with abnormal findings: Secondary | ICD-10-CM

## 2022-04-26 DIAGNOSIS — R69 Illness, unspecified: Secondary | ICD-10-CM | POA: Diagnosis not present

## 2022-04-26 LAB — BASIC METABOLIC PANEL
BUN: 16 mg/dL (ref 6–23)
CO2: 20 mEq/L (ref 19–32)
Calcium: 9.6 mg/dL (ref 8.4–10.5)
Chloride: 108 mEq/L (ref 96–112)
Creatinine, Ser: 1.14 mg/dL (ref 0.40–1.50)
GFR: 63.68 mL/min (ref >60.00)
Glucose, Bld: 82 mg/dL (ref 70–99)
Potassium: 4.7 mEq/L (ref 3.5–5.1)
Sodium: 142 mEq/L (ref 135–145)

## 2022-04-26 LAB — URINALYSIS, ROUTINE W REFLEX MICROSCOPIC
Bilirubin Urine: NEGATIVE
Hgb urine dipstick: NEGATIVE
Ketones, ur: NEGATIVE
Leukocytes,Ua: NEGATIVE
Nitrite: NEGATIVE
Specific Gravity, Urine: 1.02 (ref 1.000–1.030)
Total Protein, Urine: NEGATIVE
Urine Glucose: NEGATIVE
Urobilinogen, UA: 1 (ref 0.0–1.0)
pH: 7 (ref 5.0–8.0)

## 2022-04-26 LAB — HEPATIC FUNCTION PANEL
ALT: 11 U/L (ref 0–53)
AST: 14 U/L (ref 0–37)
Albumin: 4.2 g/dL (ref 3.5–5.2)
Alkaline Phosphatase: 66 U/L (ref 39–117)
Bilirubin, Direct: 0.1 mg/dL (ref 0.0–0.3)
Total Bilirubin: 0.4 mg/dL (ref 0.2–1.2)
Total Protein: 7.2 g/dL (ref 6.0–8.3)

## 2022-04-26 LAB — CBC WITH DIFFERENTIAL/PLATELET
Basophils Absolute: 0.1 10*3/uL (ref 0.0–0.1)
Basophils Relative: 0.8 % (ref 0.0–3.0)
Eosinophils Absolute: 0.2 10*3/uL (ref 0.0–0.7)
Eosinophils Relative: 2.9 % (ref 0.0–5.0)
HCT: 41.9 % (ref 39.0–52.0)
Hemoglobin: 13.9 g/dL (ref 13.0–17.0)
Lymphocytes Relative: 26.3 % (ref 12.0–46.0)
Lymphs Abs: 1.7 10*3/uL (ref 0.7–4.0)
MCHC: 33.2 g/dL (ref 30.0–36.0)
MCV: 83 fl (ref 78.0–100.0)
Monocytes Absolute: 0.6 10*3/uL (ref 0.1–1.0)
Monocytes Relative: 8.6 % (ref 3.0–12.0)
Neutro Abs: 3.9 10*3/uL (ref 1.4–7.7)
Neutrophils Relative %: 61.4 % (ref 43.0–77.0)
Platelets: 240 10*3/uL (ref 150.0–400.0)
RBC: 5.05 Mil/uL (ref 4.22–5.81)
RDW: 14.5 % (ref 11.5–15.5)
WBC: 6.4 10*3/uL (ref 4.0–10.5)

## 2022-04-26 LAB — VITAMIN B12: Vitamin B-12: 197 pg/mL — ABNORMAL LOW (ref 211–911)

## 2022-04-26 LAB — TSH: TSH: 0.76 u[IU]/mL (ref 0.35–5.50)

## 2022-04-26 LAB — PSA: PSA: 2.81 ng/mL (ref 0.10–4.00)

## 2022-04-26 LAB — VITAMIN D 25 HYDROXY (VIT D DEFICIENCY, FRACTURES): VITD: 13 ng/mL — ABNORMAL LOW (ref 30.00–100.00)

## 2022-04-26 MED ORDER — ROSUVASTATIN CALCIUM 40 MG PO TABS: 40.0000 mg | ORAL_TABLET | Freq: Every day | ORAL | 3 refills | Status: DC

## 2022-04-26 MED ORDER — LOSARTAN POTASSIUM 100 MG PO TABS: 100.0000 mg | ORAL_TABLET | Freq: Every day | ORAL | 3 refills | Status: DC

## 2022-04-26 MED ORDER — AMLODIPINE BESYLATE 10 MG PO TABS: 10.0000 mg | ORAL_TABLET | Freq: Every day | ORAL | 3 refills | Status: DC

## 2022-04-26 MED ORDER — CITALOPRAM HYDROBROMIDE 10 MG PO TABS: 10.0000 mg | ORAL_TABLET | Freq: Every day | ORAL | 3 refills | Status: DC

## 2022-04-26 MED ORDER — OXYBUTYNIN CHLORIDE ER 5 MG PO TB24
5.0000 mg | ORAL_TABLET | Freq: Every day | ORAL | 3 refills | Status: DC
Start: 2022-04-26 — End: 2023-09-18

## 2022-04-26 MED ORDER — TAMSULOSIN HCL 0.4 MG PO CAPS: 0.4000 mg | ORAL_CAPSULE | Freq: Every day | ORAL | 3 refills | Status: DC

## 2022-04-26 NOTE — Assessment & Plan Note (Signed)
Last vitamin D Lab Results  Component Value Date   VD25OH 10.94 (L) 04/25/2019   Low, to start oral replacement

## 2022-04-26 NOTE — Progress Notes (Signed)
Patient ID: Jesse Williams, male   DOB: 25-May-1950, 72 y.o.   MRN: [id number]         Chief Complaint:: wellness exam and htn, low vit d, depression       HPI:  Jesse Williams is a 72 y.o. male here for wellness exam; for tdap and prevnar anf flu and shingrix at the pharmacy, declines colonoscopy, ow up to date                        Also out of all meds for over 3 mo.  Pt denies chest pain, increased sob or doe, wheezing, orthopnea, PND, increased LE swelling, palpitations, dizziness or syncope.   Pt denies polydipsia, polyuria, or new focal neuro s/s.    Pt denies fever, wt loss, night sweats, loss of appetite, or other constitutional symptoms Has had mild worsening Has had mild worsening depressive symptoms, but no suicidal ideation, or panic; has ongoing anxiety, not increased recently. Not taking Vit D or B12   Wt Readings from Last 3 Encounters:  04/26/22 167 lb (75.8 kg)  04/26/21 168 lb (76.2 kg)  09/29/20 158 lb 3.2 oz (71.8 kg)   BP Readings from Last 3 Encounters:  04/26/22 (!) 142/90  04/26/21 (!) 160/90  09/29/20 (!) 160/98   Immunization History  Administered Date(s) Administered   Influenza-Unspecified 03/17/2010   PFIZER Comirnaty(Gray Top)Covid-19 Tri-Sucrose Vaccine 10/24/2019, 11/15/2019   Health Maintenance Due  Topic Date Due   Pneumonia Vaccine 59+ Years old (1 of 2 - PCV) Never done   DTaP/Tdap/Td (1 - Tdap) Never done   COLONOSCOPY (Pts 45-68yr Insurance coverage will need to be confirmed)  11/27/2019   Medicare Annual Wellness (AWV)  12/14/2021      Past Medical History:  Diagnosis Date   HLD (hyperlipidemia) 03/30/2017   Hypertension    History reviewed. No pertinent surgical history.  reports that he has been smoking. He has never used smokeless tobacco. He reports that he does not drink alcohol. No history on file for drug use. family history is not on file. No Known Allergies Current Outpatient Medications on File Prior to Visit  Medication Sig  Dispense Refill   Cholecalciferol 50 MCG (2000 UT) TABS 1 tab by mouth once daily 30 tablet 99   polyethylene glycol powder (GLYCOLAX/MIRALAX) powder Take 17 g by mouth 2 (two) times daily as needed. 289 g 1   No current facility-administered medications on file prior to visit.        ROS:  All others reviewed and negative.  Objective        PE:  BP (!) 142/90 (BP Location: Left Arm, Patient Position: Sitting, Cuff Size: Large)   Pulse 79   Temp 97.8 F (36.6 C) (Oral)   Ht 5' 11"$  (1.803 m)   Wt 167 lb (75.8 kg)   SpO2 94%   BMI 23.29 kg/m                 Constitutional: Pt appears in NAD               HENT: Head: NCAT.                Right Ear: External ear normal.                 Left Ear: External ear normal.                Eyes: . Pupils are equal,  round, and reactive to light. Conjunctivae and EOM are normal               Nose: without d/c or deformity               Neck: Neck supple. Gross normal ROM               Cardiovascular: Normal rate and regular rhythm.                 Pulmonary/Chest: Effort normal and breath sounds without rales or wheezing.                Abd:  Soft, NT, ND, + BS, no organomegaly               Neurological: Pt is alert. At baseline orientation, motor grossly intact               Skin: Skin is warm. No rashes, no other new lesions, LE edema - none               Psychiatric: Pt behavior is normal without agitation , depressed affect  Micro: none  Cardiac tracings I have personally interpreted today:  none  Pertinent Radiological findings (summarize): none   Lab Results  Component Value Date   WBC 6.4 04/26/2022   HGB 13.9 04/26/2022   HCT 41.9 04/26/2022   PLT 240.0 04/26/2022   GLUCOSE 82 04/26/2022   CHOL 194 04/25/2019   TRIG 71.0 04/25/2019   HDL 60.00 04/25/2019   LDLCALC 120 (H) 04/25/2019   ALT 11 04/26/2022   AST 14 04/26/2022   NA 142 04/26/2022   K 4.7 04/26/2022   CL 108 04/26/2022   CREATININE 1.14 04/26/2022   BUN  16 04/26/2022   CO2 20 04/26/2022   TSH 0.76 04/26/2022   PSA 2.81 04/26/2022   Assessment/Plan:  Jesse Williams is a 72 y.o. Black or African American [2] male with  has a past medical history of HLD (hyperlipidemia) (03/30/2017) and Hypertension.  Vitamin D deficiency Last vitamin D Lab Results  Component Value Date   VD25OH 10.94 (L) 04/25/2019   Low, to start oral replacement   Encounter for well adult exam with abnormal findings Age and sex appropriate education and counseling updated with regular exercise and diet Referrals for preventative services - none needed Immunizations addressed - for flu, shingrix, tdap, prevnar at the pharmacy  Smoking counseling  - pt counsled to quit, pt not ready Evidence for depression or other mood disorder - mild recent worsening, no SI or HI - to restart celexa 10 qd, declines referral counseling Most recent labs reviewed. I have personally reviewed and have noted: 1) the patient's medical and social history 2) The patient's current medications and supplements 3) The patient's height, weight, and BMI have been recorded in the chart   TOBACCO USE Pt counsled to quit, pt not ready  Hypertension, uncontrolled BP Readings from Last 3 Encounters:  04/26/22 (!) 142/90  04/26/21 (!) 160/90  09/29/20 (!) 160/98   Persistently uncontrolled, pt has not been taking meds fo rsome time but now willing to restart norvasc 10 qd, losartan 100 qd   HLD (hyperlipidemia) Lab Results  Component Value Date   LDLCALC 120 (H) 04/25/2019   Uncontrolled, pt to restart crestor 40 qd   B12 deficiency Lab Results  Component Value Date   VITAMINB12 197 (L) 04/26/2022   Low, to start oral replacement - b12 1000 mcg qd  Depression With recent mild worsening - for restart celexa 10 qd Followup: Return in about 6 months (around 10/25/2022).  Cathlean Cower, MD 04/30/2022 6:07 AM [facility]

## 2022-04-26 NOTE — Patient Instructions (Signed)
Please restart all medications  Please continue all other medications as before, and refills have been done if requested.  Please have the pharmacy call with any other refills you may need.  Please continue your efforts at being more active, low cholesterol diet, and weight control.  You are otherwise up to date with prevention measures today.  Please keep your appointments with your specialists as you may have planned  Please go to the LAB at the blood drawing area for the tests to be done  You will be contacted by phone if any changes need to be made immediately.  Otherwise, you will receive a letter about your results with an explanation, but please check with MyChart first.  Please remember to sign up for MyChart if you have not done so, as this will be important to you in the future with finding out test results, communicating by private email, and scheduling acute appointments online when needed.  Please make an Appointment to return in 6 months, or sooner if needed

## 2022-04-30 ENCOUNTER — Encounter: Payer: Self-pay | Admitting: Internal Medicine

## 2022-04-30 NOTE — Assessment & Plan Note (Signed)
Lab Results  Component Value Date   LDLCALC 120 (H) 04/25/2019   Uncontrolled, pt to restart crestor 40 qd

## 2022-04-30 NOTE — Assessment & Plan Note (Signed)
BP Readings from Last 3 Encounters:  04/26/22 (!) 142/90  04/26/21 (!) 160/90  09/29/20 (!) 160/98   Persistently uncontrolled, pt has not been taking meds fo rsome time but now willing to restart norvasc 10 qd, losartan 100 qd

## 2022-04-30 NOTE — Assessment & Plan Note (Signed)
Lab Results  Component Value Date   VITAMINB12 197 (L) 04/26/2022   Low, to start oral replacement - b12 1000 mcg qd

## 2022-04-30 NOTE — Assessment & Plan Note (Addendum)
Age and sex appropriate education and counseling updated with regular exercise and diet Referrals for preventative services - none needed Immunizations addressed - for flu, shingrix, tdap, prevnar at the pharmacy  Smoking counseling  - pt counsled to quit, pt not ready Evidence for depression or other mood disorder - mild recent worsening, no SI or HI - to restart celexa 10 qd, declines referral counseling Most recent labs reviewed. I have personally reviewed and have noted: 1) the patient's medical and social history 2) The patient's current medications and supplements 3) The patient's height, weight, and BMI have been recorded in the chart

## 2022-04-30 NOTE — Assessment & Plan Note (Signed)
Pt counsled to quit, pt not ready °

## 2022-04-30 NOTE — Assessment & Plan Note (Signed)
With recent mild worsening - for restart celexa 10 qd

## 2022-10-03 ENCOUNTER — Encounter (HOSPITAL_COMMUNITY): Payer: Self-pay | Admitting: Internal Medicine

## 2022-10-03 ENCOUNTER — Other Ambulatory Visit: Payer: Self-pay

## 2022-10-03 ENCOUNTER — Observation Stay (HOSPITAL_COMMUNITY): Payer: Medicare HMO

## 2022-10-03 ENCOUNTER — Observation Stay (HOSPITAL_COMMUNITY)
Admission: EM | Admit: 2022-10-03 | Discharge: 2022-10-04 | Disposition: A | Discharge status: Home / Self Care | Payer: Medicare HMO | Attending: Internal Medicine | Admitting: Internal Medicine

## 2022-10-03 DIAGNOSIS — N179 Acute kidney failure, unspecified: Secondary | ICD-10-CM | POA: Diagnosis not present

## 2022-10-03 DIAGNOSIS — I10 Essential (primary) hypertension: Secondary | ICD-10-CM | POA: Insufficient documentation

## 2022-10-03 DIAGNOSIS — N281 Cyst of kidney, acquired: Secondary | ICD-10-CM | POA: Diagnosis not present

## 2022-10-03 DIAGNOSIS — Z79899 Other long term (current) drug therapy: Secondary | ICD-10-CM | POA: Diagnosis not present

## 2022-10-03 DIAGNOSIS — F17200 Nicotine dependence, unspecified, uncomplicated: Secondary | ICD-10-CM | POA: Diagnosis not present

## 2022-10-03 DIAGNOSIS — R55 Syncope and collapse: Secondary | ICD-10-CM | POA: Diagnosis not present

## 2022-10-03 DIAGNOSIS — E860 Dehydration: Secondary | ICD-10-CM | POA: Diagnosis not present

## 2022-10-03 DIAGNOSIS — R42 Dizziness and giddiness: Secondary | ICD-10-CM | POA: Diagnosis not present

## 2022-10-03 DIAGNOSIS — Z743 Need for continuous supervision: Secondary | ICD-10-CM | POA: Diagnosis not present

## 2022-10-03 DIAGNOSIS — I959 Hypotension, unspecified: Secondary | ICD-10-CM | POA: Diagnosis not present

## 2022-10-03 LAB — CBC WITH DIFFERENTIAL/PLATELET
Abs Immature Granulocytes: 0.01 10*3/uL (ref 0.00–0.07)
Basophils Absolute: 0 10*3/uL (ref 0.0–0.1)
Basophils Relative: 1 %
Eosinophils Absolute: 0.1 10*3/uL (ref 0.0–0.5)
Eosinophils Relative: 2 %
HCT: 38.8 % — ABNORMAL LOW (ref 39.0–52.0)
Hemoglobin: 12.9 g/dL — ABNORMAL LOW (ref 13.0–17.0)
Immature Granulocytes: 0 %
Lymphocytes Relative: 22 %
Lymphs Abs: 1.3 10*3/uL (ref 0.7–4.0)
MCH: 28.4 pg (ref 26.0–34.0)
MCHC: 33.2 g/dL (ref 30.0–36.0)
MCV: 85.3 fL (ref 80.0–100.0)
Monocytes Absolute: 0.6 10*3/uL (ref 0.1–1.0)
Monocytes Relative: 10 %
Neutro Abs: 4 10*3/uL (ref 1.7–7.7)
Neutrophils Relative %: 65 %
Platelets: 191 10*3/uL (ref 150–400)
RBC: 4.55 MIL/uL (ref 4.22–5.81)
RDW: 14.8 % (ref 11.5–15.5)
WBC: 6 10*3/uL (ref 4.0–10.5)
nRBC: 0 % (ref 0.0–0.2)

## 2022-10-03 LAB — URINALYSIS, ROUTINE W REFLEX MICROSCOPIC
Glucose, UA: NEGATIVE mg/dL
Hgb urine dipstick: NEGATIVE
Ketones, ur: NEGATIVE mg/dL
Leukocytes,Ua: NEGATIVE
Nitrite: NEGATIVE
Protein, ur: 100 mg/dL — AB
Specific Gravity, Urine: 1.025 (ref 1.005–1.030)
pH: 5 (ref 5.0–8.0)

## 2022-10-03 LAB — COMPREHENSIVE METABOLIC PANEL
ALT: 12 U/L (ref 0–44)
AST: 16 U/L (ref 15–41)
Albumin: 3.1 g/dL — ABNORMAL LOW (ref 3.5–5.0)
Alkaline Phosphatase: 49 U/L (ref 38–126)
Anion gap: 7 (ref 5–15)
BUN: 32 mg/dL — ABNORMAL HIGH (ref 8–23)
CO2: 19 mmol/L — ABNORMAL LOW (ref 22–32)
Calcium: 8.5 mg/dL — ABNORMAL LOW (ref 8.9–10.3)
Chloride: 115 mmol/L — ABNORMAL HIGH (ref 98–111)
Creatinine, Ser: 2.11 mg/dL — ABNORMAL HIGH (ref 0.61–1.24)
GFR, Estimated: 32 mL/min — ABNORMAL LOW (ref >60)
Glucose, Bld: 105 mg/dL — ABNORMAL HIGH (ref 70–99)
Potassium: 3.9 mmol/L (ref 3.5–5.1)
Sodium: 141 mmol/L (ref 135–145)
Total Bilirubin: 0.7 mg/dL (ref 0.3–1.2)
Total Protein: 5.8 g/dL — ABNORMAL LOW (ref 6.5–8.1)

## 2022-10-03 LAB — CBG MONITORING, ED: Glucose-Capillary: 99 mg/dL (ref 70–99)

## 2022-10-03 MED ORDER — SODIUM CHLORIDE 0.9 % IV BOLUS: 1000.0000 mL | Freq: Once | INTRAVENOUS | Status: DC

## 2022-10-03 MED ORDER — SODIUM CHLORIDE 0.9% FLUSH
3.0000 mL | Freq: Two times a day (BID) | INTRAVENOUS | Status: DC
  Administered 2022-10-03 – 2022-10-04 (×2): 3 mL via INTRAVENOUS

## 2022-10-03 MED ORDER — CITALOPRAM HYDROBROMIDE 10 MG PO TABS
10.0000 mg | ORAL_TABLET | Freq: Every day | ORAL | Status: DC
  Administered 2022-10-04: 10 mg via ORAL
  Filled 2022-10-03: qty 1

## 2022-10-03 MED ORDER — SODIUM CHLORIDE 0.9 % IV BOLUS
1000.0000 mL | Freq: Once | INTRAVENOUS | Status: AC
  Administered 2022-10-03: 1000 mL via INTRAVENOUS

## 2022-10-03 MED ORDER — HYDRALAZINE HCL 20 MG/ML IJ SOLN: 5.0000 mg | Freq: Four times a day (QID) | INTRAMUSCULAR | Status: DC | PRN

## 2022-10-03 MED ORDER — ENOXAPARIN SODIUM 30 MG/0.3ML IJ SOSY
30.0000 mg | PREFILLED_SYRINGE | INTRAMUSCULAR | Status: DC
  Administered 2022-10-03: 30 mg via SUBCUTANEOUS
  Filled 2022-10-03: qty 0.3

## 2022-10-03 MED ORDER — SODIUM CHLORIDE 0.9 % IV SOLN: INTRAVENOUS | Status: DC

## 2022-10-03 MED ORDER — LACTATED RINGERS IV BOLUS
1000.0000 mL | Freq: Once | INTRAVENOUS | Status: AC
  Administered 2022-10-03: 1000 mL via INTRAVENOUS

## 2022-10-03 MED ORDER — TAMSULOSIN HCL 0.4 MG PO CAPS
0.4000 mg | ORAL_CAPSULE | Freq: Every day | ORAL | Status: DC
  Administered 2022-10-04: 0.4 mg via ORAL
  Filled 2022-10-03: qty 1

## 2022-10-03 MED ORDER — POLYETHYLENE GLYCOL 3350 17 G PO PACK: 17.0000 g | PACK | Freq: Two times a day (BID) | ORAL | Status: DC | PRN

## 2022-10-03 MED ORDER — ROSUVASTATIN CALCIUM 20 MG PO TABS
40.0000 mg | ORAL_TABLET | Freq: Every day | ORAL | Status: DC
  Administered 2022-10-04: 40 mg via ORAL
  Filled 2022-10-03: qty 2

## 2022-10-03 MED ORDER — OXYBUTYNIN CHLORIDE ER 5 MG PO TB24
5.0000 mg | ORAL_TABLET | Freq: Every day | ORAL | Status: DC
  Administered 2022-10-03: 5 mg via ORAL
  Filled 2022-10-03 (×2): qty 1

## 2022-10-03 NOTE — ED Triage Notes (Signed)
Pt bib [facility] from a neighbors house after having an episode of dizziness today. 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 Pt only had an energy drink and a honeybun for breakfast. Denies loc, denies fall, denies ha but states when he stands up he feels woozy.  EMS NS en route with a bp 86/52

## 2022-10-03 NOTE — H&P (Signed)
History and Physical    Williams Jesse XBJ:[id number] DOB: [birth year] DOA: 10/03/2022  PCP: Corwin Levins, MD (Confirm with patient/family/NH records and if not entered, this has to be entered at [facility] point of entry) Patient coming from: Home  I have personally briefly reviewed patient's old medical records in [facility]  Chief Complaint: Feeling better  HPI: Jesse Williams is a 72 y.o. male with medical history significant of HTN, HLD, presented with near syncope.  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  Became lightheaded, almost blacked out had to lie down and call friend who called EMS denies any chest pain, no vision changes denies any weakness numbness of the [place] limbs.  No urinary symptoms no abdominal pain or diarrhea.  Patient reported that he takes amlodipine for blood pressure control which recently ran out and he started to take losartan since Sunday.  Denies any such lightheadedness or weakness yesterday  ED Course: Afebrile, blood pressure in the lower 80s responded to IV bolus monitors systolic 2 lower 308.  Not hypoxic.  X-ray negative for acute infiltrates.  Blood work showed creatinine 2.1 compared to baseline 1.0 bicarb 19, WBC 6.0.  UA negative for UTI.  EKG showed no acute PR or QTc level changes  Review of Systems: As per HPI otherwise 14 point review of systems negative.    Past Medical History:  Diagnosis Date   HLD (hyperlipidemia) 03/30/2017   Hypertension     History reviewed. No pertinent surgical history.   reports that he has been smoking. He has never used smokeless tobacco. He reports that he does not drink alcohol. No history on file for drug use.  No Known Allergies  History reviewed. No pertinent family history.   Prior to Admission medications   Medication Sig Start Date End Date Taking? Authorizing Provider  losartan (COZAAR) 100 MG tablet Take 1 tablet (100  mg total) by mouth daily. 04/26/22  Yes Corwin Levins, MD  amLODipine (NORVASC) 10 MG tablet Take 1 tablet (10 mg total) by mouth daily. 04/26/22   Corwin Levins, MD  Cholecalciferol 50 MCG (2000 UT) TABS 1 tab by mouth once daily 09/29/20   Corwin Levins, MD  citalopram (CELEXA) 10 MG tablet Take 1 tablet (10 mg total) by mouth daily. 04/26/22   Corwin Levins, MD  oxybutynin (DITROPAN-XL) 5 MG 24 hr tablet Take 1 tablet (5 mg total) by mouth at bedtime. 04/26/22   Corwin Levins, MD  polyethylene glycol powder (GLYCOLAX/MIRALAX) powder Take 17 g by mouth 2 (two) times daily as needed. 03/17/17   Trena Platt D, PA  rosuvastatin (CRESTOR) 40 MG tablet Take 1 tablet (40 mg total) by mouth daily. 04/26/22   Corwin Levins, MD  tamsulosin (FLOMAX) 0.4 MG CAPS capsule Take 1 capsule (0.4 mg total) by mouth daily. 04/26/22   Corwin Levins, MD    Physical Exam: Vitals:   10/03/22 1445 10/03/22 1500 10/03/22 1515 10/03/22 1651  BP: (!) 99/35 108/76 109/71   Pulse: 67 63 61   Resp: 20 (!) 25 19   Temp:    98.5 F (36.9 C)  TempSrc:      SpO2: 95% (!) 89% 92%   Weight:      Height:        Constitutional: NAD, calm, comfortable Vitals:   10/03/22 1445 10/03/22 1500 10/03/22 1515 10/03/22  1651  BP: (!) 99/35 108/76 109/71   Pulse: 67 63 61   Resp: 20 (!) 25 19   Temp:    98.5 F (36.9 C)  TempSrc:      SpO2: 95% (!) 89% 92%   Weight:      Height:       Eyes: PERRL, lids and conjunctivae normal ENMT: Mucous membranes are moist. Posterior pharynx clear of any exudate or lesions.Normal dentition.  Neck: normal, supple, no masses, no thyromegaly Respiratory: clear to auscultation bilaterally, no wheezing, no crackles. Normal respiratory effort. No accessory muscle use.  Cardiovascular: Regular rate and rhythm, no murmurs / rubs / gallops. No extremity edema. 2+ pedal pulses. No carotid bruits.  Abdomen: no tenderness, no masses palpated. No hepatosplenomegaly. Bowel sounds positive.   Musculoskeletal: no clubbing / cyanosis. No joint deformity upper and lower extremities. Good ROM, no contractures. Normal muscle tone.  Skin: no rashes, lesions, ulcers. No induration Neurologic: CN 2-12 grossly intact. Sensation intact, DTR normal. Strength 5/5 in all 4.  Psychiatric: Normal judgment and insight. Alert and oriented x 3. Normal mood.     Labs on Admission: I have personally reviewed following labs and imaging studies  CBC: Recent Labs  Lab 10/03/22 1248  WBC 6.0  NEUTROABS 4.0  HGB 12.9*  HCT 38.8*  MCV 85.3  PLT 191   Basic Metabolic Panel: Recent Labs  Lab 10/03/22 1248  NA 141  K 3.9  CL 115*  CO2 19*  GLUCOSE 105*  BUN 32*  CREATININE 2.11*  CALCIUM 8.5*   GFR: Estimated Creatinine Clearance: 32.7 mL/min (A) (by C-G formula based on SCr of 2.11 mg/dL (H)). Liver Function Tests: Recent Labs  Lab 10/03/22 1248  AST 16  ALT 12  ALKPHOS 49  BILITOT 0.7  PROT 5.8*  ALBUMIN 3.1*   No results for input(s): "LIPASE", "AMYLASE" in the last 168 hours. No results for input(s): "AMMONIA" in the last 168 hours. Coagulation Profile: No results for input(s): "INR", "PROTIME" in the last 168 hours. Cardiac Enzymes: No results for input(s): "CKTOTAL", "CKMB", "CKMBINDEX", "TROPONINI" in the last 168 hours. BNP (last 3 results) No results for input(s): "PROBNP" in the last 8760 hours. HbA1C: No results for input(s): "HGBA1C" in the last 72 hours. CBG: Recent Labs  Lab 10/03/22 1328  GLUCAP 99   Lipid Profile: No results for input(s): "CHOL", "HDL", "LDLCALC", "TRIG", "CHOLHDL", "LDLDIRECT" in the last 72 hours. Thyroid Function Tests: No results for input(s): "TSH", "T4TOTAL", "FREET4", "T3FREE", "THYROIDAB" in the last 72 hours. Anemia Panel: No results for input(s): "VITAMINB12", "FOLATE", "FERRITIN", "TIBC", "IRON", "RETICCTPCT" in the last 72 hours. Urine analysis:    Component Value Date/Time   COLORURINE AMBER (A) 10/03/2022 1229    APPEARANCEUR HAZY (A) 10/03/2022 1229   LABSPEC 1.025 10/03/2022 1229   PHURINE 5.0 10/03/2022 1229   GLUCOSEU NEGATIVE 10/03/2022 1229   GLUCOSEU NEGATIVE 04/26/2022 1119   HGBUR NEGATIVE 10/03/2022 1229   BILIRUBINUR SMALL (A) 10/03/2022 1229   BILIRUBINUR negative 03/17/2017 1653   KETONESUR NEGATIVE 10/03/2022 1229   PROTEINUR 100 (A) 10/03/2022 1229   UROBILINOGEN 1.0 04/26/2022 1119   NITRITE NEGATIVE 10/03/2022 1229   LEUKOCYTESUR NEGATIVE 10/03/2022 1229    Radiological Exams on Admission: No results found.  EKG: Independently reviewed.  Sinus, no acute ST changes, no acute PR or QTc interval changes  Assessment/Plan Principal Problem:   Syncope Active Problems:   Syncope, vasovagal  (please populate well all problems here in Problem List. (For  example, if patient is on BP meds at home and you resume or decide to hold them, it is a problem that needs to be her. Same for CAD, COPD, HLD and so on)  Near syncope, vasovagal Orthostatic hypotension -Likely secondary to volume contraction/dehydration as well as side effect of blood pressure medication/losartan -Hold off all home BP meds -Review of patient's record showed that on February 2024 visit with PCP, patient was noncompliant with BP meds, and PCP restart both amlodipine and losartan.  I called patient pharmacy [facility], who reported patient only had losartan refilled in February.  No refill for amlodipine this year.  Went back to talk to patient patient confirmed that he has been taking small pink pill (likely amlodipine) until it runs out last week and then he started to take losartan (green pills) since Sunday.  He denies taking both amlodipine and losartan together at any time.  AKI -Prerenal likely secondary to volume contraction and hypotension -Received 2 L of IV fluids, continue maintenance IV fluid for 1 day and recheck kidney function tomorrow -Check FeNa study and renal U/S -UA showed proteinuria but no UTI, will  repeat UA tomorrow  BPH -No complaints, continue Flomax  HTN -As above  DVT prophylaxis: Lovenox Code Status: Full code Family Communication: Brother at bedside Disposition Plan: Expect less than 2 midnight hospital stay Consults called: None Admission status: Telemetry observation   Emeline General MD [facility] Pager [phone] 10/03/2022, 5:41 PM

## 2022-10-03 NOTE — ED Provider Notes (Signed)
[place] EMERGENCY DEPARTMENT AT [facility] Provider Note   CSN: [id number] Arrival date & time: 10/03/22  1224     History  Chief Complaint  Patient presents with   Hypotension    Jesse Williams is a 72 y.o. male.  With past medical history of hyperlipidemia, hypertension, IBS, latent syphilis who presents to the emergency department with hypotension.  States after mowing some lawns this morning he was feeling lightheaded, dizzy.  EMS was called and initial blood pressures were 70 systolic.  They gave him of IV fluids with improvement of pressure only to the 80s systolic.  Patient states that he has otherwise been feeling well.  He admits that he probably does not drink enough water.  He usually has a honey bun and energy drink in the morning for breakfast.  States that he had this prior to mowing about 3 lawns this morning in his neighborhood.  He states on the way he back to his house that he began to feel lightheaded, dizzy, short of breath.  He states he felt like he was going to pass out but never had a syncopal episode.  He denies having chest pain or palpitations.  He does note that he has been taking only 1 of 2 antihypertensive medications prescribed to him.  He denies otherwise having any recent illnesses, diarrhea, vomiting.  Does not take diuretics. Currently feels fatigued at rest.  HPI     Home Medications Prior to Admission medications   Medication Sig Start Date End Date Taking? Authorizing Provider  losartan (COZAAR) 100 MG tablet Take 1 tablet (100 mg total) by mouth daily. 04/26/22  Yes Corwin Levins, MD  amLODipine (NORVASC) 10 MG tablet Take 1 tablet (10 mg total) by mouth daily. 04/26/22   Corwin Levins, MD  Cholecalciferol 50 MCG (2000 UT) TABS 1 tab by mouth once daily 09/29/20   Corwin Levins, MD  citalopram (CELEXA) 10 MG tablet Take 1 tablet (10 mg total) by mouth daily. 04/26/22   Corwin Levins, MD  oxybutynin (DITROPAN-XL) 5 MG 24 hr tablet  Take 1 tablet (5 mg total) by mouth at bedtime. 04/26/22   Corwin Levins, MD  polyethylene glycol powder (GLYCOLAX/MIRALAX) powder Take 17 g by mouth 2 (two) times daily as needed. 03/17/17   Trena Platt D, PA  rosuvastatin (CRESTOR) 40 MG tablet Take 1 tablet (40 mg total) by mouth daily. 04/26/22   Corwin Levins, MD  tamsulosin (FLOMAX) 0.4 MG CAPS capsule Take 1 capsule (0.4 mg total) by mouth daily. 04/26/22   Corwin Levins, MD      Allergies    Patient has no known allergies.    Review of Systems   Review of Systems  Constitutional:  Positive for fatigue.  Respiratory:  Positive for shortness of breath.   Neurological:  Positive for dizziness, weakness and light-headedness.  All other systems reviewed and are negative.   Physical Exam Updated Vital Signs BP 109/71   Pulse 61   Temp 98.6 F (37 C) (Oral)   Resp 19   Ht 5\' 11"  (1.803 m)   Wt 77.1 kg   SpO2 92%   BMI 23.71 kg/m  Physical Exam Vitals and nursing note reviewed.  Constitutional:      General: He is not in acute distress.    Appearance: Normal appearance. He is normal weight. He is not ill-appearing.  HENT:     Head: Normocephalic.  Mouth/Throat:     Mouth: Mucous membranes are dry.     Pharynx: Oropharynx is clear.  Eyes:     General: No scleral icterus.    Extraocular Movements: Extraocular movements intact.  Cardiovascular:     Rate and Rhythm: Normal rate and regular rhythm.     Pulses: Normal pulses.  Pulmonary:     Effort: Pulmonary effort is normal.     Breath sounds: Normal breath sounds.  Abdominal:     General: Bowel sounds are normal.     Palpations: Abdomen is soft.  Musculoskeletal:        General: Normal range of motion.     Right lower leg: No edema.     Left lower leg: No edema.  Skin:    General: Skin is warm and dry.     Capillary Refill: Capillary refill takes less than 2 seconds.  Neurological:     General: No focal deficit present.     Mental Status: He is alert and  oriented to person, place, and time.  Psychiatric:        Mood and Affect: Mood normal.        Behavior: Behavior normal.        Thought Content: Thought content normal.        Judgment: Judgment normal.     ED Results / Procedures / Treatments   Labs (all labs ordered are listed, but only abnormal results are displayed) Labs Reviewed  COMPREHENSIVE METABOLIC PANEL - Abnormal; Notable for the following components:      Result Value   Chloride 115 (*)    CO2 19 (*)    Glucose, Bld 105 (*)    BUN 32 (*)    Creatinine, Ser 2.11 (*)    Calcium 8.5 (*)    Total Protein 5.8 (*)    Albumin 3.1 (*)    GFR, Estimated 32 (*)    All other components within normal limits  CBC WITH DIFFERENTIAL/PLATELET - Abnormal; Notable for the following components:   Hemoglobin 12.9 (*)    HCT 38.8 (*)    All other components within normal limits  URINALYSIS, ROUTINE W REFLEX MICROSCOPIC - Abnormal; Notable for the following components:   Color, Urine AMBER (*)    APPearance HAZY (*)    Bilirubin Urine SMALL (*)    Protein, ur 100 (*)    Bacteria, UA RARE (*)    All other components within normal limits  CBG MONITORING, ED   EKG EKG Interpretation Date/Time:  Monday October 03 2022 12:35:54 EDT Ventricular Rate:  72 PR Interval:  182 QRS Duration:  109 QT Interval:  409 QTC Calculation: 448 R Axis:   -5  Text Interpretation: Sinus rhythm Confirmed by Gloris Manchester ([id number]) on 10/03/2022 2:30:30 PM  Radiology No results found.  Procedures Procedures   Medications Ordered in ED Medications  sodium chloride 0.9 % bolus 1,000 mL (1,000 mLs Intravenous New Bag/Given 10/03/22 1247)  lactated ringers bolus 1,000 mL (1,000 mLs Intravenous New Bag/Given 10/03/22 1452)    ED Course/ Medical Decision Making/ A&P    Medical Decision Making Amount and/or Complexity of Data Reviewed Labs: ordered.  Risk Decision regarding hospitalization.  Initial Impression and Ddx 72 year old male who  presents to the emergency department with hypotension. Has been recently exerting himself outdoors with minimal PO water intake. Does drink energy drinks.  Here now with hypotension requiring 3L IVF to resuscitate. BP have stabilized with IVF but he does have AKI.  Will admit for obs and repeated renal function, ongoing IVF resuscutation.  Dr. Willeen Cass, PGY-2 ED medicine to speak with hospitalist regarding admission at change of shift.   Final Clinical Impression(s) / ED Diagnoses Final diagnoses:  None    Rx / DC Orders ED Discharge Orders     None         Cristopher Peru, PA-C 10/03/22 1546    Gloris Manchester, MD 10/04/22 1700

## 2022-10-03 NOTE — ED Notes (Signed)
ED TO INPATIENT HANDOFF REPORT  ED Nurse Name and Phone #: [phone]  S Name/Age/Gender Jesse Williams 72 y.o. male Room/Bed: 023C/023C  Code Status   Code Status: Full Code  Home/SNF/Other Home Patient oriented to: self, place, time, and situation Is this baseline? Yes   Triage Complete: Triage complete  Chief Complaint Syncope [R55]  Triage Note Pt bib [facility] from a neighbors house after having an episode of dizziness today. 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 Pt only had an energy drink and a honeybun for breakfast. Denies loc, denies fall, denies ha but states when he stands up he feels woozy.  EMS NS en route with a bp 86/52   Allergies No Known Allergies  Level of Care/Admitting Diagnosis ED Disposition     ED Disposition  Admit   Condition  --   Comment  Hospital Area: [facility] [[id number]]  Level of Care: Telemetry Cardiac [103]  May place patient in observation at [facility] or [facility] if equivalent level of care is available:: No  Covid Evaluation: Asymptomatic - no recent exposure (last 10 days) testing not required  Diagnosis: Syncope [206001]  Admitting Physician: Emeline General [[id number]]  Attending Physician: Emeline General [[id number]]          B Medical/Surgery History Past Medical History:  Diagnosis Date   HLD (hyperlipidemia) 03/30/2017   Hypertension    No past surgical history on file.   A IV Location/Drains/Wounds Patient Lines/Drains/Airways Status     Active Line/Drains/Airways     Name Placement date Placement time Site Days   Peripheral IV 10/03/22 18 G Anterior;Left Forearm 10/03/22  1247  Forearm  less than 1            Intake/Output Last 24 hours  Intake/Output Summary (Last 24 hours) at 10/03/2022 1736 Last data filed at 10/03/2022 1736 Gross per 24 hour  Intake 2000 ml  Output --  Net 2000 ml    Labs/Imaging Results for orders placed or  performed during the hospital encounter of 10/03/22 (from the past 48 hour(s))  Urinalysis, Routine w reflex microscopic -Urine, Clean Catch     Status: Abnormal   Collection Time: 10/03/22 12:29 PM  Result Value Ref Range   Color, Urine AMBER (A) YELLOW    Comment: BIOCHEMICALS MAY BE AFFECTED BY COLOR   APPearance HAZY (A) CLEAR   Specific Gravity, Urine 1.025 1.005 - 1.030   pH 5.0 5.0 - 8.0   Glucose, UA NEGATIVE NEGATIVE mg/dL   Hgb urine dipstick NEGATIVE NEGATIVE   Bilirubin Urine SMALL (A) NEGATIVE   Ketones, ur NEGATIVE NEGATIVE mg/dL   Protein, ur 284 (A) NEGATIVE mg/dL   Nitrite NEGATIVE NEGATIVE   Leukocytes,Ua NEGATIVE NEGATIVE   RBC / HPF 0-5 0 - 5 RBC/hpf   WBC, UA 6-10 0 - 5 WBC/hpf   Bacteria, UA RARE (A) NONE SEEN   Squamous Epithelial / HPF 0-5 0 - 5 /HPF   Mucus PRESENT    Hyaline Casts, UA PRESENT     Comment: Performed at [facility], [address], [place], [place] [address]  Comprehensive metabolic panel     Status: Abnormal   Collection Time: 10/03/22 12:48 PM  Result Value Ref Range   Sodium 141 135 - 145 mmol/L   Potassium 3.9 3.5 - 5.1 mmol/L   Chloride 115 (H) 98 - 111 mmol/L   CO2 19 (L) 22 -  32 mmol/L   Glucose, Bld 105 (H) 70 - 99 mg/dL    Comment: Glucose reference range applies only to samples taken after fasting for at least 8 hours.   BUN 32 (H) 8 - 23 mg/dL   Creatinine, Ser 1.61 (H) 0.61 - 1.24 mg/dL   Calcium 8.5 (L) 8.9 - 10.3 mg/dL   Total Protein 5.8 (L) 6.5 - 8.1 g/dL   Albumin 3.1 (L) 3.5 - 5.0 g/dL   AST 16 15 - 41 U/L   ALT 12 0 - 44 U/L   Alkaline Phosphatase 49 38 - 126 U/L   Total Bilirubin 0.7 0.3 - 1.2 mg/dL   GFR, Estimated 32 (L) >60 mL/min    Comment: (NOTE) Calculated using the CKD-EPI Creatinine Equation (2021)    Anion gap 7 5 - 15    Comment: Performed at [facility], [address], [place], [place] [address]  CBC with Differential     Status: Abnormal   Collection Time: 10/03/22 12:48 PM   Result Value Ref Range   WBC 6.0 4.0 - 10.5 K/uL   RBC 4.55 4.22 - 5.81 MIL/uL   Hemoglobin 12.9 (L) 13.0 - 17.0 g/dL   HCT 54.0 (L) 98.1 - 19.1 %   MCV 85.3 80.0 - 100.0 fL   MCH 28.4 26.0 - 34.0 pg   MCHC 33.2 30.0 - 36.0 g/dL   RDW 47.8 29.5 - 62.1 %   Platelets 191 150 - 400 K/uL   nRBC 0.0 0.0 - 0.2 %   Neutrophils Relative % 65 %   Neutro Abs 4.0 1.7 - 7.7 K/uL   Lymphocytes Relative 22 %   Lymphs Abs 1.3 0.7 - 4.0 K/uL   Monocytes Relative 10 %   Monocytes Absolute 0.6 0.1 - 1.0 K/uL   Eosinophils Relative 2 %   Eosinophils Absolute 0.1 0.0 - 0.5 K/uL   Basophils Relative 1 %   Basophils Absolute 0.0 0.0 - 0.1 K/uL   Immature Granulocytes 0 %   Abs Immature Granulocytes 0.01 0.00 - 0.07 K/uL    Comment: Performed at [facility], [address], [place], [place] [address]  CBG monitoring, ED     Status: None   Collection Time: 10/03/22  1:28 PM  Result Value Ref Range   Glucose-Capillary 99 70 - 99 mg/dL    Comment: Glucose reference range applies only to samples taken after fasting for at least 8 hours.   No results found.  Pending Labs Unresulted Labs (From admission, onward)     Start     Ordered   10/04/22 0500  Basic metabolic panel  Tomorrow morning,   R        10/03/22 1624            Vitals/Pain Today's Vitals   10/03/22 1500 10/03/22 1515 10/03/22 1651 10/03/22 1701  BP: 108/76 109/71    Pulse: 63 61    Resp: (!) 25 19    Temp:   98.5 F (36.9 C)   TempSrc:      SpO2: (!) 89% 92%    Weight:      Height:      PainSc:    0-No pain    Isolation Precautions No active isolations  Medications Medications  rosuvastatin (CRESTOR) tablet 40 mg (has no administration in time range)  citalopram (CELEXA) tablet 10 mg (has no administration in time range)  polyethylene glycol (MIRALAX / GLYCOLAX) packet 17 g (has no administration in  time range)  oxybutynin (DITROPAN-XL) 24 hr tablet 5 mg (has no administration in time range)  tamsulosin  (FLOMAX) capsule 0.4 mg (has no administration in time range)  sodium chloride flush (NS) 0.9 % injection 3 mL (has no administration in time range)  enoxaparin (LOVENOX) injection 30 mg (has no administration in time range)  sodium chloride 0.9 % bolus 1,000 mL (0 mLs Intravenous Stopped 10/03/22 1736)  lactated ringers bolus 1,000 mL (0 mLs Intravenous Stopped 10/03/22 1735)    Mobility walks     Focused Assessments Cardiac Assessment Handoff:    No results found for: "CKTOTAL", "CKMB", "CKMBINDEX", "TROPONINI" No results found for: "DDIMER" Does the Patient currently have chest pain? No    R Recommendations: See Admitting Provider Note  Report given to:   Additional Notes:

## 2022-10-03 NOTE — ED Notes (Signed)
Tolerating po flds 

## 2022-10-04 ENCOUNTER — Observation Stay (HOSPITAL_COMMUNITY): Payer: Medicare HMO

## 2022-10-04 DIAGNOSIS — R55 Syncope and collapse: Secondary | ICD-10-CM

## 2022-10-04 DIAGNOSIS — E860 Dehydration: Secondary | ICD-10-CM | POA: Diagnosis not present

## 2022-10-04 DIAGNOSIS — N179 Acute kidney failure, unspecified: Secondary | ICD-10-CM

## 2022-10-04 LAB — BASIC METABOLIC PANEL
Anion gap: 5 (ref 5–15)
BUN: 22 mg/dL (ref 8–23)
CO2: 22 mmol/L (ref 22–32)
Calcium: 8.3 mg/dL — ABNORMAL LOW (ref 8.9–10.3)
Chloride: 113 mmol/L — ABNORMAL HIGH (ref 98–111)
Creatinine, Ser: 1.08 mg/dL (ref 0.61–1.24)
GFR, Estimated: >60 mL/min (ref >60)
Glucose, Bld: 77 mg/dL (ref 70–99)
Potassium: 3.8 mmol/L (ref 3.5–5.1)
Sodium: 140 mmol/L (ref 135–145)

## 2022-10-04 LAB — URINALYSIS, ROUTINE W REFLEX MICROSCOPIC
Bilirubin Urine: NEGATIVE
Glucose, UA: NEGATIVE mg/dL
Hgb urine dipstick: NEGATIVE
Ketones, ur: NEGATIVE mg/dL
Leukocytes,Ua: NEGATIVE
Nitrite: NEGATIVE
Protein, ur: NEGATIVE mg/dL
Specific Gravity, Urine: 1.02 (ref 1.005–1.030)
pH: 6 (ref 5.0–8.0)

## 2022-10-04 LAB — ECHOCARDIOGRAM COMPLETE
Area-P 1/2: 2.44 cm2
Height: 71 in
S' Lateral: 4.4 cm
Weight: 2720 oz

## 2022-10-04 LAB — CBG MONITORING, ED: Glucose-Capillary: 81 mg/dL (ref 70–99)

## 2022-10-04 MED ORDER — LOSARTAN POTASSIUM 100 MG PO TABS: 100.0000 mg | ORAL_TABLET | Freq: Every day | ORAL | Status: DC

## 2022-10-04 MED ORDER — AMLODIPINE BESYLATE 10 MG PO TABS: 10.0000 mg | ORAL_TABLET | Freq: Every day | ORAL | Status: DC

## 2022-10-04 NOTE — Progress Notes (Addendum)
Initial plan was to d/c patient home this AM as it was reported to me that he walked on his own with no issues.  Now patient is c/o feeling unsteady-- have ordered orthostatics and a PT eval for imminent d/c. Marlin Canary DO

## 2022-10-04 NOTE — Discharge Summary (Signed)
Physician Discharge Summary  Jesse Williams UVO:[id number] DOB: July 08, 1950 DOA: 10/03/2022  PCP: Corwin Levins, MD  Admit date: 10/03/2022 Discharge date: 10/04/2022  Admitted From: home Discharge disposition: home   Recommendations for Outpatient Follow-Up:   Monitor BP at home nonemergent follow-up multiphasic CT or MRI of kidneys   Discharge Diagnosis:   Principal Problem:   Syncope Active Problems:   Syncope, vasovagal    Discharge Condition: Improved.  Diet recommendation: Low sodium, heart healthy.    Wound care: None.  Code status: Full.   History of Present Illness:   Jesse Williams is a 72 y.o. male with medical history significant of HTN, HLD, presented with near syncope.   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  Became lightheaded, almost blacked out had to lie down and call friend who called EMS denies any chest pain, no vision changes denies any weakness numbness of the [place] limbs.  No urinary symptoms no abdominal pain or diarrhea.  Patient reported that he takes amlodipine for blood pressure control which recently ran out and he started to take losartan since Sunday.  Denies any such lightheadedness or weakness yesterday   ED Course: Afebrile, blood pressure in the lower 80s responded to IV bolus monitors systolic 2 lower 742.  Not hypoxic.  X-ray negative for acute infiltrates.  Blood work showed creatinine 2.1 compared to baseline 1.0 bicarb 19, WBC 6.0.  UA negative for UTI.  EKG showed no acute PR or QTc level changes   Hospital Course by Problem:   Near syncope from Orthostatic hypotension -Likely secondary to volume contraction/dehydration as well as side effect of blood pressure medication/losartan -Hold off all home BP meds for now-- resume in a few days -Review of patient's record showed that on February 2024 visit with PCP, patient was noncompliant with BP  meds, and PCP restart both amlodipine and losartan.  Admitting MD called patient pharmacy [facility], who reported patient only had losartan refilled in February.  No refill for amlodipine this year.  Went back to talk to patient patient confirmed that he has been taking small pink pill (likely amlodipine) until it runs out last week and then he started to take losartan (green pills) since Sunday.  He denies taking both amlodipine and losartan together at any time. -PT eval as patient says he feels "unsteady"-- no PT follow up   AKI -Prerenal likely secondary to volume contraction and hypotension -resolved with IVF -U/S did suggest outpatient: non-emergent follow-up multiphasic CT or MRI    BPH -No complaints, continue Flomax   HTN -resume ACE    Medical Consultants:      Discharge Exam:   Vitals:   10/04/22 1042 10/04/22 1125  BP:  129/87  Pulse:  67  Resp:  20  Temp: 98.1 F (36.7 C) 98.3 F (36.8 C)  SpO2:  98%   Vitals:   10/04/22 0700 10/04/22 0830 10/04/22 1042 10/04/22 1125  BP: 129/77 (!) 149/93  129/87  Pulse: (!) 51 (!) 55  67  Resp: 17 18  20   Temp:   98.1 F (36.7 C) 98.3 F (36.8 C)  TempSrc:   Oral Oral  SpO2: 98% 98%  98%  Weight:      Height:        General exam: Appears calm and comfortable.    The results of significant diagnostics from this hospitalization (  including imaging, microbiology, ancillary and laboratory) are listed below for reference.     Procedures and Diagnostic Studies:   ECHOCARDIOGRAM COMPLETE  Result Date: 10/04/2022    ECHOCARDIOGRAM REPORT   Patient Name:   Jesse Williams [facility] Date of Exam: 10/04/2022 Medical Rec #:  [id number]       Height:       71.0 in Accession #:    [id number]      Weight:       170.0 lb Date of Birth:  Feb 01, 1951       BSA:          1.968 m Patient Age:    72 years        BP:           129/77 mmHg Patient Gender: M               HR:           51 bpm. Exam Location:  Inpatient Procedure: 2D Echo, Color Doppler  and Cardiac Doppler Indications:    R55 Syncope  History:        Patient has no prior history of Echocardiogram examinations.                 Risk Factors:Hypertension and Dyslipidemia.  Sonographer:    Harriette Bouillon Referring Phys: [id number] PING T ZHANG IMPRESSIONS  1. Left ventricular ejection fraction, by estimation, is 50 to 55%. The left ventricle has low normal function. The left ventricle has no regional wall motion abnormalities. Left ventricular diastolic parameters were normal.  2. Right ventricular systolic function is normal. The right ventricular size is normal. There is normal pulmonary artery systolic pressure.  3. The mitral valve is normal in structure. No evidence of mitral valve regurgitation. No evidence of mitral stenosis.  4. The aortic valve is normal in structure. There is mild calcification of the aortic valve. Aortic valve regurgitation is not visualized. No aortic stenosis is present.  5. The inferior vena cava is normal in size with greater than 50% respiratory variability, suggesting right atrial pressure of 3 mmHg. FINDINGS  Left Ventricle: Left ventricular ejection fraction, by estimation, is 50 to 55%. The left ventricle has low normal function. The left ventricle has no regional wall motion abnormalities. The left ventricular internal cavity size was normal in size. There is no left ventricular hypertrophy. Left ventricular diastolic parameters were normal. Right Ventricle: The right ventricular size is normal. No increase in right ventricular wall thickness. Right ventricular systolic function is normal. There is normal pulmonary artery systolic pressure. The tricuspid regurgitant velocity is 2.20 m/s, and  with an assumed right atrial pressure of 3 mmHg, the estimated right ventricular systolic pressure is 22.4 mmHg. Left Atrium: Left atrial size was normal in size. Right Atrium: Right atrial size was normal in size. Pericardium: There is no evidence of pericardial effusion. Mitral  Valve: The mitral valve is normal in structure. No evidence of mitral valve regurgitation. No evidence of mitral valve stenosis. Tricuspid Valve: The tricuspid valve is normal in structure. Tricuspid valve regurgitation is trivial. No evidence of tricuspid stenosis. Aortic Valve: The aortic valve is normal in structure. There is mild calcification of the aortic valve. Aortic valve regurgitation is not visualized. No aortic stenosis is present. Pulmonic Valve: The pulmonic valve was normal in structure. Pulmonic valve regurgitation is not visualized. No evidence of pulmonic stenosis. Aorta: The aortic root is normal in size and structure. Venous: The inferior vena cava is normal  in size with greater than 50% respiratory variability, suggesting right atrial pressure of 3 mmHg. IAS/Shunts: No atrial level shunt detected by color flow Doppler.  LEFT VENTRICLE PLAX 2D LVIDd:         6.10 cm   Diastology LVIDs:         4.40 cm   LV e' medial:    5.11 cm/s LV PW:         0.80 cm   LV E/e' medial:  9.7 LV IVS:        0.80 cm   LV e' lateral:   10.70 cm/s LVOT diam:     2.30 cm   LV E/e' lateral: 4.6 LV SV:         72 LV SV Index:   37 LVOT Area:     4.15 cm  RIGHT VENTRICLE            IVC RV S prime:     9.39 cm/s  IVC diam: 1.80 cm TAPSE (M-mode): 2.5 cm LEFT ATRIUM             Index        RIGHT ATRIUM           Index LA diam:        4.00 cm 2.03 cm/m   RA Area:     17.20 cm LA Vol (A2C):   58.6 ml 29.78 ml/m  RA Volume:   46.30 ml  23.53 ml/m LA Vol (A4C):   57.2 ml 29.07 ml/m LA Biplane Vol: 62.8 ml 31.91 ml/m  AORTIC VALVE LVOT Vmax:   71.10 cm/s LVOT Vmean:  47.500 cm/s LVOT VTI:    0.174 m  AORTA Ao Root diam: 3.80 cm Ao Asc diam:  3.80 cm MITRAL VALVE               TRICUSPID VALVE MV Area (PHT): 2.44 cm    TR Peak grad:   19.4 mmHg MV Decel Time: 311 msec    TR Vmax:        220.00 cm/s MV E velocity: 49.70 cm/s MV A velocity: 54.80 cm/s  SHUNTS MV E/A ratio:  0.91        Systemic VTI:  0.17 m                             Systemic Diam: 2.30 cm Aditya Sabharwal Electronically signed by Dorthula Nettles Signature Date/Time: 10/04/2022/10:17:06 AM    Final    US RENAL  Result Date: 10/03/2022 CLINICAL DATA:  Acute kidney injury EXAM: RENAL / URINARY TRACT ULTRASOUND COMPLETE COMPARISON:  CT abdomen and pelvis done on 11/11/2009 FINDINGS: Right Kidney: Renal measurements: 11.6 x 5.1 x 5.1 cm = volume: 157.1 mL. There is 2.1 x 1.9 cm cyst in the midportion. There is a 6.8 x 4.6 cm cyst in the lower pole with thin internal septation. Smaller cysts were seen in the same regions in the right kidney in previous CT. In the previous CT the cysts measure up to 3.9 cm in maximum diameter. There is no hydronephrosis. There is increased cortical echogenicity. Left Kidney: Renal measurements: 10.9 x 5.2 x 5.4 cm = volume: 158.9 mL. There is no hydronephrosis. There is increased cortical echogenicity. Bladder: Unremarkable. Other: None. IMPRESSION: There is no hydronephrosis. There is increased cortical echogenicity suggesting possible medical renal disease. There is 6.8 cm lobulated cyst in the lower pole of right kidney with thin internal septation.  A 2.1 cm cyst in the upper pole of right kidney. In previous CT done on 11/11/2009, smaller cystic structures were seen in the same regions, possibly suggesting benign etiology. However, in view of interval increase in size and presence of internal septations in the larger lesion in the lower pole of right kidney, nonemergent follow-up multiphasic CT or MRI may be considered. Electronically Signed   By: Ernie Avena M.D.   On: 10/03/2022 19:25     Labs:   Basic Metabolic Panel: Recent Labs  Lab 10/03/22 1248 10/04/22 0332  NA 141 140  K 3.9 3.8  CL 115* 113*  CO2 19* 22  GLUCOSE 105* 77  BUN 32* 22  CREATININE 2.11* 1.08  CALCIUM 8.5* 8.3*   GFR Estimated Creatinine Clearance: 63.9 mL/min (by C-G formula based on SCr of 1.08 mg/dL). Liver Function  Tests: Recent Labs  Lab 10/03/22 1248  AST 16  ALT 12  ALKPHOS 49  BILITOT 0.7  PROT 5.8*  ALBUMIN 3.1*   No results for input(s): "LIPASE", "AMYLASE" in the last 168 hours. No results for input(s): "AMMONIA" in the last 168 hours. Coagulation profile No results for input(s): "INR", "PROTIME" in the last 168 hours.  CBC: Recent Labs  Lab 10/03/22 1248  WBC 6.0  NEUTROABS 4.0  HGB 12.9*  HCT 38.8*  MCV 85.3  PLT 191   Cardiac Enzymes: No results for input(s): "CKTOTAL", "CKMB", "CKMBINDEX", "TROPONINI" in the last 168 hours. BNP: Invalid input(s): "POCBNP" CBG: Recent Labs  Lab 10/03/22 1328 10/04/22 0440  GLUCAP 99 81   D-Dimer No results for input(s): "DDIMER" in the last 72 hours. Hgb A1c No results for input(s): "HGBA1C" in the last 72 hours. Lipid Profile No results for input(s): "CHOL", "HDL", "LDLCALC", "TRIG", "CHOLHDL", "LDLDIRECT" in the last 72 hours. Thyroid function studies No results for input(s): "TSH", "T4TOTAL", "T3FREE", "THYROIDAB" in the last 72 hours.  Invalid input(s): "FREET3" Anemia work up No results for input(s): "VITAMINB12", "FOLATE", "FERRITIN", "TIBC", "IRON", "RETICCTPCT" in the last 72 hours. Microbiology No results found for this or any previous visit (from the past 240 hour(s)).   Discharge Instructions:   Discharge Instructions     Diet - low sodium heart healthy   Complete by: As directed    Discharge instructions   Complete by: As directed    Monitor your BP at home-- can buy an arm cuff OTC at [facility] or [facility] Avoid the heat Stay hydrated   Increase activity slowly   Complete by: As directed       Allergies as of 10/04/2022   No Known Allergies      Medication List     STOP taking these medications    amLODipine 10 MG tablet Commonly known as: NORVASC       TAKE these medications    Cholecalciferol 50 MCG (2000 UT) Tabs 1 tab by mouth once daily   citalopram 10 MG tablet Commonly known as:  CELEXA Take 1 tablet (10 mg total) by mouth daily.   losartan 100 MG tablet Commonly known as: COZAAR Take 1 tablet (100 mg total) by mouth daily. Start taking on: October 08, 2022 What changed: These instructions start on October 08, 2022. If you are unsure what to do until then, ask your doctor or other care provider.   oxybutynin 5 MG 24 hr tablet Commonly known as: DITROPAN-XL Take 1 tablet (5 mg total) by mouth at bedtime.   polyethylene glycol powder 17 GM/SCOOP powder Commonly known as:  GLYCOLAX/MIRALAX Take 17 g by mouth 2 (two) times daily as needed.   rosuvastatin 40 MG tablet Commonly known as: CRESTOR Take 1 tablet (40 mg total) by mouth daily.   tamsulosin 0.4 MG Caps capsule Commonly known as: FLOMAX Take 1 capsule (0.4 mg total) by mouth daily.        Follow-up Information     Corwin Levins, MD Follow up in 1 week(s).   Specialties: Internal Medicine, Radiology Why: BP check Contact information: [address] [place] [address] [phone]                  Time coordinating discharge: 45 min  Signed:  Joseph Art DO  [facility] 10/04/2022, 1:17 PM

## 2022-10-04 NOTE — Progress Notes (Signed)
  Echocardiogram 2D Echocardiogram has been performed.  Leda Roys RDCS 10/04/2022, 9:44 AM

## 2022-10-04 NOTE — Evaluation (Signed)
Physical Therapy Evaluation Patient Details Name: Jesse Williams MRN: [id number] DOB: 1950/05/07 Today's Date: 10/04/2022  History of Present Illness  72 y.o. male presents to [facility] hospital on 10/03/2022 after pre-syncopal symptoms. PMH includes HTN, HLD.  Clinical Impression  Pt presents to PT without significant deficits in mobility, ambulating independently for limited community distances. Pt reports being concerned about feelings of instability when initially getting out of bed early in the morning. Pt reports standing up quickly to start his day, initially feels unstable but this improves with time. PT encourages the pt to increase time between positional changes, especially early in the morning. PT also provides encouragement for adequate water intake to maintain hydration. Pt has no current PT needs at this time. Acute PT signing off.        Assistance Recommended at Discharge None  If plan is discharge home, recommend the following:  Can travel by private vehicle           Equipment Recommendations None recommended by PT  Recommendations for Other Services       Functional Status Assessment Patient has not had a recent decline in their functional status     Precautions / Restrictions Precautions Precautions: None Restrictions Weight Bearing Restrictions: No      Mobility  Bed Mobility                    Transfers                   General transfer comment: not assessed, pt received in standing in hallway at nurses station    Ambulation/Gait Ambulation/Gait assistance: Independent Gait Distance (Feet): 500 Feet Assistive device: None Gait Pattern/deviations: WFL(Within Functional Limits) Gait velocity: functional Gait velocity interpretation: >2.62 ft/sec, indicative of community ambulatory   General Gait Details: steady step-through gait, no significant deviations noted  Stairs            Wheelchair Mobility     Tilt Bed     Modified Rankin (Stroke Patients Only)       Balance Overall balance assessment: Independent                                           Pertinent Vitals/Pain Pain Assessment Pain Assessment: No/denies pain    Home Living Family/patient expects to be discharged to:: Private residence Living Arrangements: Alone   Type of Home: House Home Access: Level entry       Home Layout: One level Home Equipment: Cane - single Librarian, academic (2 wheels)      Prior Function Prior Level of Function : Independent/Modified Independent;Working/employed             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       Higher education careers adviser        Extremity/Trunk Assessment   Upper Extremity Assessment Upper Extremity Assessment: Overall WFL for tasks assessed    Lower Extremity Assessment Lower Extremity Assessment: Overall WFL for tasks assessed    Cervical / Trunk Assessment Cervical / Trunk Assessment: Normal  Communication   Communication: No difficulties  Cognition Arousal/Alertness: Awake/alert Behavior During Therapy: WFL for tasks assessed/performed Overall Cognitive Status: Within Functional Limits for tasks assessed  General Comments General comments (skin integrity, edema, etc.): VSS on RA    Exercises     Assessment/Plan    PT Assessment Patient does not need any further PT services  PT Problem List         PT Treatment Interventions      PT Goals (Current goals can be found in the Care Plan section)       Frequency       Co-evaluation               AM-PAC PT "6 Clicks" Mobility  Outcome Measure Help needed turning from your back to your side while in a flat bed without using bedrails?: None Help needed moving from lying on your back to sitting on the side of a flat bed without using bedrails?: None Help needed moving to and from a bed to a chair  (including a wheelchair)?: None Help needed standing up from a chair using your arms (e.g., wheelchair or bedside chair)?: None Help needed to walk in hospital room?: None Help needed climbing 3-5 steps with a railing? : None 6 Click Score: 24    End of Session   Activity Tolerance: Patient tolerated treatment well Patient left:  (left standing at nurses station) Nurse Communication: Mobility status PT Visit Diagnosis: Other abnormalities of gait and mobility (R26.89)    Time: 3875-6433 PT Time Calculation (min) (ACUTE ONLY): 10 min   Charges:   PT Evaluation $PT Eval Low Complexity: 1 Low   PT General Charges $$ ACUTE PT VISIT: 1 Visit         Arlyss Gandy, PT, DPT Acute Rehabilitation Office [phone]   Arlyss Gandy 10/04/2022, 1:10 PM

## 2022-10-04 NOTE — Progress Notes (Signed)
PROGRESS NOTE    Jesse Williams  QMV:[id number] DOB: 06-11-1950 DOA: 10/03/2022 PCP: Corwin Levins, MD    Brief Narrative:  Jesse Williams is a 72 y.o. male with medical history significant of HTN, HLD, presented with near syncope.   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  Became lightheaded, almost blacked out had to lie down and call friend who called EMS denies any chest pain, no vision changes denies any weakness numbness of the limbs.  No urinary symptoms no abdominal pain or diarrhea.  Patient reported that he takes amlodipine for blood pressure control which recently ran out and he started to take losartan since Sunday.  Assessment and Plan:  Near syncope from Orthostatic hypotension -Likely secondary to volume contraction/dehydration as well as side effect of blood pressure medication/losartan -Hold off all home BP meds -Review of patient's record showed that on February 2024 visit with PCP, patient was noncompliant with BP meds, and PCP restart both amlodipine and losartan.  I called patient pharmacy [facility], who reported patient only had losartan refilled in February.  No refill for amlodipine this year.  Went back to talk to patient patient confirmed that he has been taking small pink pill (likely amlodipine) until it runs out last week and then he started to take losartan (green pills) since Sunday.  He denies taking both amlodipine and losartan together at any time. -PT eval as patient says he feels "unsteady", recheck orthos   AKI -Prerenal likely secondary to volume contraction and hypotension -resolved with IVF -U/S did suggest outpatient: nonemergent follow-up multiphasic CT or MRI    BPH -No complaints, continue Flomax   HTN -holding meds  DVT prophylaxis: enoxaparin (LOVENOX) injection 30 mg Start: 10/03/22 2200    Code Status: Full Code   Disposition Plan:  Level of care: Telemetry Cardiac Status is:  Observation The patient remains OBS appropriate and will d/c before 2 midnights.    Consultants:    Subjective: Feels unsteady on his feet  Objective: Vitals:   10/04/22 0700 10/04/22 0830 10/04/22 1042 10/04/22 1125  BP: 129/77 (!) 149/93  129/87  Pulse: (!) 51 (!) 55  67  Resp: 17 18  20   Temp:   98.1 F (36.7 C) 98.3 F (36.8 C)  TempSrc:   Oral Oral  SpO2: 98% 98%  98%  Weight:      Height:        Intake/Output Summary (Last 24 hours) at 10/04/2022 1157 Last data filed at 10/04/2022 1125 Gross per 24 hour  Intake 3000 ml  Output --  Net 3000 ml   Filed Weights   10/03/22 1239  Weight: 77.1 kg    Examination:   General: Appearance:    Well developed, well nourished male in no acute distress     Lungs:     Clear to auscultation bilaterally, respirations unlabored  Heart:    Normal heart rate. Normal rhythm. No murmurs, rubs, or gallops.    MS:   All extremities are intact.    Neurologic:   Awake, alert, oriented x 3. No apparent focal neurological           defect.        Data Reviewed: I have personally reviewed following labs and imaging studies  CBC: Recent Labs  Lab 10/03/22 1248  WBC 6.0  NEUTROABS 4.0  HGB 12.9*  HCT 38.8*  MCV 85.3  PLT 191  Basic Metabolic Panel: Recent Labs  Lab 10/03/22 1248 10/04/22 0332  NA 141 140  K 3.9 3.8  CL 115* 113*  CO2 19* 22  GLUCOSE 105* 77  BUN 32* 22  CREATININE 2.11* 1.08  CALCIUM 8.5* 8.3*   GFR: Estimated Creatinine Clearance: 63.9 mL/min (by C-G formula based on SCr of 1.08 mg/dL). Liver Function Tests: Recent Labs  Lab 10/03/22 1248  AST 16  ALT 12  ALKPHOS 49  BILITOT 0.7  PROT 5.8*  ALBUMIN 3.1*   No results for input(s): "LIPASE", "AMYLASE" in the last 168 hours. No results for input(s): "AMMONIA" in the last 168 hours. Coagulation Profile: No results for input(s): "INR", "PROTIME" in the last 168 hours. Cardiac Enzymes: No results for input(s): "CKTOTAL", "CKMB",  "CKMBINDEX", "TROPONINI" in the last 168 hours. BNP (last 3 results) No results for input(s): "PROBNP" in the last 8760 hours. HbA1C: No results for input(s): "HGBA1C" in the last 72 hours. CBG: Recent Labs  Lab 10/03/22 1328 10/04/22 0440  GLUCAP 99 81   Lipid Profile: No results for input(s): "CHOL", "HDL", "LDLCALC", "TRIG", "CHOLHDL", "LDLDIRECT" in the last 72 hours. Thyroid Function Tests: No results for input(s): "TSH", "T4TOTAL", "FREET4", "T3FREE", "THYROIDAB" in the last 72 hours. Anemia Panel: No results for input(s): "VITAMINB12", "FOLATE", "FERRITIN", "TIBC", "IRON", "RETICCTPCT" in the last 72 hours. Sepsis Labs: No results for input(s): "PROCALCITON", "LATICACIDVEN" in the last 168 hours.  No results found for this or any previous visit (from the past 240 hour(s)).       Radiology Studies: ECHOCARDIOGRAM COMPLETE  Result Date: 10/04/2022    ECHOCARDIOGRAM REPORT   Patient Name:   Jesse Williams [facility] Date of Exam: 10/04/2022 Medical Rec #:  [id number]       Height:       71.0 in Accession #:    [id number]      Weight:       170.0 lb Date of Birth:  [birth year]       BSA:          1.968 m Patient Age:    72 years        BP:           129/77 mmHg Patient Gender: M               HR:           51 bpm. Exam Location:  Inpatient Procedure: 2D Echo, Color Doppler and Cardiac Doppler Indications:    R55 Syncope  History:        Patient has no prior history of Echocardiogram examinations.                 Risk Factors:Hypertension and Dyslipidemia.  Sonographer:    Harriette Bouillon Referring Phys: [id number] PING T ZHANG IMPRESSIONS  1. Left ventricular ejection fraction, by estimation, is 50 to 55%. The left ventricle has low normal function. The left ventricle has no regional wall motion abnormalities. Left ventricular diastolic parameters were normal.  2. Right ventricular systolic function is normal. The right ventricular size is normal. There is normal pulmonary artery systolic pressure.   3. The mitral valve is normal in structure. No evidence of mitral valve regurgitation. No evidence of mitral stenosis.  4. The aortic valve is normal in structure. There is mild calcification of the aortic valve. Aortic valve regurgitation is not visualized. No aortic stenosis is present.  5. The inferior vena cava is normal in size with greater than 50% respiratory variability, suggesting  right atrial pressure of 3 mmHg. FINDINGS  Left Ventricle: Left ventricular ejection fraction, by estimation, is 50 to 55%. The left ventricle has low normal function. The left ventricle has no regional wall motion abnormalities. The left ventricular internal cavity size was normal in size. There is no left ventricular hypertrophy. Left ventricular diastolic parameters were normal. Right Ventricle: The right ventricular size is normal. No increase in right ventricular wall thickness. Right ventricular systolic function is normal. There is normal pulmonary artery systolic pressure. The tricuspid regurgitant velocity is 2.20 m/s, and  with an assumed right atrial pressure of 3 mmHg, the estimated right ventricular systolic pressure is 22.4 mmHg. Left Atrium: Left atrial size was normal in size. Right Atrium: Right atrial size was normal in size. Pericardium: There is no evidence of pericardial effusion. Mitral Valve: The mitral valve is normal in structure. No evidence of mitral valve regurgitation. No evidence of mitral valve stenosis. Tricuspid Valve: The tricuspid valve is normal in structure. Tricuspid valve regurgitation is trivial. No evidence of tricuspid stenosis. Aortic Valve: The aortic valve is normal in structure. There is mild calcification of the aortic valve. Aortic valve regurgitation is not visualized. No aortic stenosis is present. Pulmonic Valve: The pulmonic valve was normal in structure. Pulmonic valve regurgitation is not visualized. No evidence of pulmonic stenosis. Aorta: The aortic root is normal in size and  structure. Venous: The inferior vena cava is normal in size with greater than 50% respiratory variability, suggesting right atrial pressure of 3 mmHg. IAS/Shunts: No atrial level shunt detected by color flow Doppler.  LEFT VENTRICLE PLAX 2D LVIDd:         6.10 cm   Diastology LVIDs:         4.40 cm   LV e' medial:    5.11 cm/s LV PW:         0.80 cm   LV E/e' medial:  9.7 LV IVS:        0.80 cm   LV e' lateral:   10.70 cm/s LVOT diam:     2.30 cm   LV E/e' lateral: 4.6 LV SV:         72 LV SV Index:   37 LVOT Area:     4.15 cm  RIGHT VENTRICLE            IVC RV S prime:     9.39 cm/s  IVC diam: 1.80 cm TAPSE (M-mode): 2.5 cm LEFT ATRIUM             Index        RIGHT ATRIUM           Index LA diam:        4.00 cm 2.03 cm/m   RA Area:     17.20 cm LA Vol (A2C):   58.6 ml 29.78 ml/m  RA Volume:   46.30 ml  23.53 ml/m LA Vol (A4C):   57.2 ml 29.07 ml/m LA Biplane Vol: 62.8 ml 31.91 ml/m  AORTIC VALVE LVOT Vmax:   71.10 cm/s LVOT Vmean:  47.500 cm/s LVOT VTI:    0.174 m  AORTA Ao Root diam: 3.80 cm Ao Asc diam:  3.80 cm MITRAL VALVE               TRICUSPID VALVE MV Area (PHT): 2.44 cm    TR Peak grad:   19.4 mmHg MV Decel Time: 311 msec    TR Vmax:        220.00 cm/s  MV E velocity: 49.70 cm/s MV A velocity: 54.80 cm/s  SHUNTS MV E/A ratio:  0.91        Systemic VTI:  0.17 m                            Systemic Diam: 2.30 cm Aditya Sabharwal Electronically signed by Dorthula Nettles Signature Date/Time: 10/04/2022/10:17:06 AM    Final    US RENAL  Result Date: 10/03/2022 CLINICAL DATA:  Acute kidney injury EXAM: RENAL / URINARY TRACT ULTRASOUND COMPLETE COMPARISON:  CT abdomen and pelvis done on 11/11/2009 FINDINGS: Right Kidney: Renal measurements: 11.6 x 5.1 x 5.1 cm = volume: 157.1 mL. There is 2.1 x 1.9 cm cyst in the midportion. There is a 6.8 x 4.6 cm cyst in the lower pole with thin internal septation. Smaller cysts were seen in the same regions in the right kidney in previous CT. In the previous CT the  cysts measure up to 3.9 cm in maximum diameter. There is no hydronephrosis. There is increased cortical echogenicity. Left Kidney: Renal measurements: 10.9 x 5.2 x 5.4 cm = volume: 158.9 mL. There is no hydronephrosis. There is increased cortical echogenicity. Bladder: Unremarkable. Other: None. IMPRESSION: There is no hydronephrosis. There is increased cortical echogenicity suggesting possible medical renal disease. There is 6.8 cm lobulated cyst in the lower pole of right kidney with thin internal septation. A 2.1 cm cyst in the upper pole of right kidney. In previous CT done on 11/11/2009, smaller cystic structures were seen in the same regions, possibly suggesting benign etiology. However, in view of interval increase in size and presence of internal septations in the larger lesion in the lower pole of right kidney, nonemergent follow-up multiphasic CT or MRI may be considered. Electronically Signed   By: Ernie Avena M.D.   On: 10/03/2022 19:25        Scheduled Meds:  citalopram  10 mg Oral Daily   enoxaparin (LOVENOX) injection  30 mg Subcutaneous Q24H   oxybutynin  5 mg Oral QHS   rosuvastatin  40 mg Oral Daily   sodium chloride flush  3 mL Intravenous Q12H   tamsulosin  0.4 mg Oral Daily   Continuous Infusions:  sodium chloride Stopped (10/04/22 1125)     LOS: 0 days    Time spent: 4 minutes spent on chart review, discussion with nursing staff, consultants, updating family and interview/physical exam; more than 50% of that time was spent in counseling and/or coordination of care.    Joseph Art, DO [facility] Available via Epic secure chat 7am-7pm After these hours, please refer to coverage provider listed on [email or web address] 10/04/2022, 11:57 AM

## 2022-10-04 NOTE — ED Notes (Signed)
ED TO INPATIENT HANDOFF REPORT  ED Nurse Name and Phone #:  Nehemiah Settle [phone]  S Name/Age/Gender Jesse Williams 72 y.o. male Room/Bed: 040C/040C  Code Status   Code Status: Full Code  Home/SNF/Other Home Patient oriented to: self, place, time, and situation Is this baseline? Yes   Triage Complete: Triage complete  Chief Complaint Syncope [R55]  Triage Note Pt bib [facility] from a neighbors house after having an episode of dizziness today. 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 Pt only had an energy drink and a honeybun for breakfast. Denies loc, denies fall, denies ha but states when he stands up he feels woozy.  EMS NS en route with a bp 86/52   Allergies No Known Allergies  Level of Care/Admitting Diagnosis ED Disposition     ED Disposition  Admit   Condition  --   Comment  Hospital Area: [facility] [[id number]]  Level of Care: Telemetry Cardiac [103]  May place patient in observation at [facility] or [facility] if equivalent level of care is available:: No  Covid Evaluation: Asymptomatic - no recent exposure (last 10 days) testing not required  Diagnosis: Syncope [206001]  Admitting Physician: Emeline General [[id number]]  Attending Physician: Emeline General [[id number]]          B Medical/Surgery History Past Medical History:  Diagnosis Date   HLD (hyperlipidemia) 03/30/2017   Hypertension    History reviewed. No pertinent surgical history.   A IV Location/Drains/Wounds Patient Lines/Drains/Airways Status     Active Line/Drains/Airways     None            Intake/Output Last 24 hours  Intake/Output Summary (Last 24 hours) at 10/04/2022 1232 Last data filed at 10/04/2022 1125 Gross per 24 hour  Intake 3000 ml  Output --  Net 3000 ml    Labs/Imaging Results for orders placed or performed during the hospital encounter of 10/03/22 (from the past 48 hour(s))  Urinalysis, Routine w reflex  microscopic -Urine, Clean Catch     Status: Abnormal   Collection Time: 10/03/22 12:29 PM  Result Value Ref Range   Color, Urine AMBER (A) YELLOW    Comment: BIOCHEMICALS MAY BE AFFECTED BY COLOR   APPearance HAZY (A) CLEAR   Specific Gravity, Urine 1.025 1.005 - 1.030   pH 5.0 5.0 - 8.0   Glucose, UA NEGATIVE NEGATIVE mg/dL   Hgb urine dipstick NEGATIVE NEGATIVE   Bilirubin Urine SMALL (A) NEGATIVE   Ketones, ur NEGATIVE NEGATIVE mg/dL   Protein, ur 782 (A) NEGATIVE mg/dL   Nitrite NEGATIVE NEGATIVE   Leukocytes,Ua NEGATIVE NEGATIVE   RBC / HPF 0-5 0 - 5 RBC/hpf   WBC, UA 6-10 0 - 5 WBC/hpf   Bacteria, UA RARE (A) NONE SEEN   Squamous Epithelial / HPF 0-5 0 - 5 /HPF   Mucus PRESENT    Hyaline Casts, UA PRESENT     Comment: Performed at [facility], [address] [place], [place] [address]  Comprehensive metabolic panel     Status: Abnormal   Collection Time: 10/03/22 12:48 PM  Result Value Ref Range   Sodium 141 135 - 145 mmol/L   Potassium 3.9 3.5 - 5.1 mmol/L   Chloride 115 (H) 98 - 111 mmol/L   CO2 19 (L) 22 - 32 mmol/L   Glucose, Bld 105 (H) 70 - 99 mg/dL    Comment: Glucose reference range applies only to  samples taken after fasting for at least 8 hours.   BUN 32 (H) 8 - 23 mg/dL   Creatinine, Ser 1.47 (H) 0.61 - 1.24 mg/dL   Calcium 8.5 (L) 8.9 - 10.3 mg/dL   Total Protein 5.8 (L) 6.5 - 8.1 g/dL   Albumin 3.1 (L) 3.5 - 5.0 g/dL   AST 16 15 - 41 U/L   ALT 12 0 - 44 U/L   Alkaline Phosphatase 49 38 - 126 U/L   Total Bilirubin 0.7 0.3 - 1.2 mg/dL   GFR, Estimated 32 (L) >60 mL/min    Comment: (NOTE) Calculated using the CKD-EPI Creatinine Equation (2021)    Anion gap 7 5 - 15    Comment: Performed at [facility], [address] [place], [place] [address]  CBC with Differential     Status: Abnormal   Collection Time: 10/03/22 12:48 PM  Result Value Ref Range   WBC 6.0 4.0 - 10.5 K/uL   RBC 4.55 4.22 - 5.81 MIL/uL   Hemoglobin 12.9 (L) 13.0 - 17.0  g/dL   HCT 21.3 (L) 08.6 - 57.8 %   MCV 85.3 80.0 - 100.0 fL   MCH 28.4 26.0 - 34.0 pg   MCHC 33.2 30.0 - 36.0 g/dL   RDW 46.9 62.9 - 52.8 %   Platelets 191 150 - 400 K/uL   nRBC 0.0 0.0 - 0.2 %   Neutrophils Relative % 65 %   Neutro Abs 4.0 1.7 - 7.7 K/uL   Lymphocytes Relative 22 %   Lymphs Abs 1.3 0.7 - 4.0 K/uL   Monocytes Relative 10 %   Monocytes Absolute 0.6 0.1 - 1.0 K/uL   Eosinophils Relative 2 %   Eosinophils Absolute 0.1 0.0 - 0.5 K/uL   Basophils Relative 1 %   Basophils Absolute 0.0 0.0 - 0.1 K/uL   Immature Granulocytes 0 %   Abs Immature Granulocytes 0.01 0.00 - 0.07 K/uL    Comment: Performed at [facility], [address], [place], [place] [address]  CBG monitoring, ED     Status: None   Collection Time: 10/03/22  1:28 PM  Result Value Ref Range   Glucose-Capillary 99 70 - 99 mg/dL    Comment: Glucose reference range applies only to samples taken after fasting for at least 8 hours.  Urinalysis, Routine w reflex microscopic -Urine, Clean Catch     Status: None   Collection Time: 10/03/22 11:58 PM  Result Value Ref Range   Color, Urine YELLOW YELLOW   APPearance CLEAR CLEAR   Specific Gravity, Urine 1.020 1.005 - 1.030   pH 6.0 5.0 - 8.0   Glucose, UA NEGATIVE NEGATIVE mg/dL   Hgb urine dipstick NEGATIVE NEGATIVE   Bilirubin Urine NEGATIVE NEGATIVE   Ketones, ur NEGATIVE NEGATIVE mg/dL   Protein, ur NEGATIVE NEGATIVE mg/dL   Nitrite NEGATIVE NEGATIVE   Leukocytes,Ua NEGATIVE NEGATIVE    Comment: Performed at [facility], [address] [place], [place] [address]  Basic metabolic panel     Status: Abnormal   Collection Time: 10/04/22  3:32 AM  Result Value Ref Range   Sodium 140 135 - 145 mmol/L   Potassium 3.8 3.5 - 5.1 mmol/L   Chloride 113 (H) 98 - 111 mmol/L   CO2 22 22 - 32 mmol/L   Glucose, Bld 77 70 - 99 mg/dL    Comment: Glucose reference range applies only to samples taken after fasting for at least 8 hours.  BUN 22 8 - 23  mg/dL   Creatinine, Ser 1.61 0.61 - 1.24 mg/dL   Calcium 8.3 (L) 8.9 - 10.3 mg/dL   GFR, Estimated >09 >60 mL/min    Comment: (NOTE) Calculated using the CKD-EPI Creatinine Equation (2021)    Anion gap 5 5 - 15    Comment: Performed at [facility], [address] [place], [place] [address]  CBG monitoring, ED     Status: None   Collection Time: 10/04/22  4:40 AM  Result Value Ref Range   Glucose-Capillary 81 70 - 99 mg/dL    Comment: Glucose reference range applies only to samples taken after fasting for at least 8 hours.   ECHOCARDIOGRAM COMPLETE  Result Date: 10/04/2022    ECHOCARDIOGRAM REPORT   Patient Name:   Jesse Williams Date of Exam: 10/04/2022 Medical Rec #:  [id number]       Height:       71.0 in Accession #:    [id number]      Weight:       170.0 lb Date of Birth:  06/21/1950       BSA:          1.968 m Patient Age:    72 years        BP:           129/77 mmHg Patient Gender: M               HR:           51 bpm. Exam Location:  Inpatient Procedure: 2D Echo, Color Doppler and Cardiac Doppler Indications:    R55 Syncope  History:        Patient has no prior history of Echocardiogram examinations.                 Risk Factors:Hypertension and Dyslipidemia.  Sonographer:    Harriette Bouillon Referring Phys: [id number] PING T ZHANG IMPRESSIONS  1. Left ventricular ejection fraction, by estimation, is 50 to 55%. The left ventricle has low normal function. The left ventricle has no regional wall motion abnormalities. Left ventricular diastolic parameters were normal.  2. Right ventricular systolic function is normal. The right ventricular size is normal. There is normal pulmonary artery systolic pressure.  3. The mitral valve is normal in structure. No evidence of mitral valve regurgitation. No evidence of mitral stenosis.  4. The aortic valve is normal in structure. There is mild calcification of the aortic valve. Aortic valve regurgitation is not visualized. No aortic stenosis is present.   5. The inferior vena cava is normal in size with greater than 50% respiratory variability, suggesting right atrial pressure of 3 mmHg. FINDINGS  Left Ventricle: Left ventricular ejection fraction, by estimation, is 50 to 55%. The left ventricle has low normal function. The left ventricle has no regional wall motion abnormalities. The left ventricular internal cavity size was normal in size. There is no left ventricular hypertrophy. Left ventricular diastolic parameters were normal. Right Ventricle: The right ventricular size is normal. No increase in right ventricular wall thickness. Right ventricular systolic function is normal. There is normal pulmonary artery systolic pressure. The tricuspid regurgitant velocity is 2.20 m/s, and  with an assumed right atrial pressure of 3 mmHg, the estimated right ventricular systolic pressure is 22.4 mmHg. Left Atrium: Left atrial size was normal in size. Right Atrium: Right atrial size was normal in size. Pericardium: There is no evidence of pericardial effusion. Mitral Valve: The mitral valve  is normal in structure. No evidence of mitral valve regurgitation. No evidence of mitral valve stenosis. Tricuspid Valve: The tricuspid valve is normal in structure. Tricuspid valve regurgitation is trivial. No evidence of tricuspid stenosis. Aortic Valve: The aortic valve is normal in structure. There is mild calcification of the aortic valve. Aortic valve regurgitation is not visualized. No aortic stenosis is present. Pulmonic Valve: The pulmonic valve was normal in structure. Pulmonic valve regurgitation is not visualized. No evidence of pulmonic stenosis. Aorta: The aortic root is normal in size and structure. Venous: The inferior vena cava is normal in size with greater than 50% respiratory variability, suggesting right atrial pressure of 3 mmHg. IAS/Shunts: No atrial level shunt detected by color flow Doppler.  LEFT VENTRICLE PLAX 2D LVIDd:         6.10 cm   Diastology LVIDs:          4.40 cm   LV e' medial:    5.11 cm/s LV PW:         0.80 cm   LV E/e' medial:  9.7 LV IVS:        0.80 cm   LV e' lateral:   10.70 cm/s LVOT diam:     2.30 cm   LV E/e' lateral: 4.6 LV SV:         72 LV SV Index:   37 LVOT Area:     4.15 cm  RIGHT VENTRICLE            IVC RV S prime:     9.39 cm/s  IVC diam: 1.80 cm TAPSE (M-mode): 2.5 cm LEFT ATRIUM             Index        RIGHT ATRIUM           Index LA diam:        4.00 cm 2.03 cm/m   RA Area:     17.20 cm LA Vol (A2C):   58.6 ml 29.78 ml/m  RA Volume:   46.30 ml  23.53 ml/m LA Vol (A4C):   57.2 ml 29.07 ml/m LA Biplane Vol: 62.8 ml 31.91 ml/m  AORTIC VALVE LVOT Vmax:   71.10 cm/s LVOT Vmean:  47.500 cm/s LVOT VTI:    0.174 m  AORTA Ao Root diam: 3.80 cm Ao Asc diam:  3.80 cm MITRAL VALVE               TRICUSPID VALVE MV Area (PHT): 2.44 cm    TR Peak grad:   19.4 mmHg MV Decel Time: 311 msec    TR Vmax:        220.00 cm/s MV E velocity: 49.70 cm/s MV A velocity: 54.80 cm/s  SHUNTS MV E/A ratio:  0.91        Systemic VTI:  0.17 m                            Systemic Diam: 2.30 cm Aditya Sabharwal Electronically signed by Dorthula Nettles Signature Date/Time: 10/04/2022/10:17:06 AM    Final    US RENAL  Result Date: 10/03/2022 CLINICAL DATA:  Acute kidney injury EXAM: RENAL / URINARY TRACT ULTRASOUND COMPLETE COMPARISON:  CT abdomen and pelvis done on 11/11/2009 FINDINGS: Right Kidney: Renal measurements: 11.6 x 5.1 x 5.1 cm = volume: 157.1 mL. There is 2.1 x 1.9 cm cyst in the midportion. There is a 6.8 x 4.6 cm cyst in the lower pole with  thin internal septation. Smaller cysts were seen in the same regions in the right kidney in previous CT. In the previous CT the cysts measure up to 3.9 cm in maximum diameter. There is no hydronephrosis. There is increased cortical echogenicity. Left Kidney: Renal measurements: 10.9 x 5.2 x 5.4 cm = volume: 158.9 mL. There is no hydronephrosis. There is increased cortical echogenicity. Bladder: Unremarkable. Other:  None. IMPRESSION: There is no hydronephrosis. There is increased cortical echogenicity suggesting possible medical renal disease. There is 6.8 cm lobulated cyst in the lower pole of right kidney with thin internal septation. A 2.1 cm cyst in the upper pole of right kidney. In previous CT done on 11/11/2009, smaller cystic structures were seen in the same regions, possibly suggesting benign etiology. However, in view of interval increase in size and presence of internal septations in the larger lesion in the lower pole of right kidney, nonemergent follow-up multiphasic CT or MRI may be considered. Electronically Signed   By: Ernie Avena M.D.   On: 10/03/2022 19:25    Pending Labs Unresulted Labs (From admission, onward)     Start     Ordered   10/03/22 1748  Sodium, urine, random  Once,   R        10/03/22 1747   10/03/22 1748  Creatinine, urine, random  Once,   R        10/03/22 1747            Vitals/Pain Today's Vitals   10/04/22 0700 10/04/22 0830 10/04/22 1042 10/04/22 1125  BP: 129/77 (!) 149/93  129/87  Pulse: (!) 51 (!) 55  67  Resp: 17 18  20   Temp:   98.1 F (36.7 C) 98.3 F (36.8 C)  TempSrc:   Oral Oral  SpO2: 98% 98%  98%  Weight:      Height:      PainSc:        Isolation Precautions No active isolations  Medications Medications  rosuvastatin (CRESTOR) tablet 40 mg (40 mg Oral Given 10/04/22 0951)  citalopram (CELEXA) tablet 10 mg (10 mg Oral Given 10/04/22 0951)  polyethylene glycol (MIRALAX / GLYCOLAX) packet 17 g (has no administration in time range)  oxybutynin (DITROPAN-XL) 24 hr tablet 5 mg (5 mg Oral Given 10/03/22 2207)  tamsulosin (FLOMAX) capsule 0.4 mg (0.4 mg Oral Given 10/04/22 0951)  sodium chloride flush (NS) 0.9 % injection 3 mL (3 mLs Intravenous Given 10/04/22 0949)  enoxaparin (LOVENOX) injection 30 mg (30 mg Subcutaneous Given 10/03/22 2208)  0.9 %  sodium chloride infusion (0 mLs Intravenous Stopped 10/04/22 1125)  hydrALAZINE  (APRESOLINE) injection 5 mg (has no administration in time range)  sodium chloride 0.9 % bolus 1,000 mL (0 mLs Intravenous Stopped 10/03/22 1736)  lactated ringers bolus 1,000 mL (0 mLs Intravenous Stopped 10/03/22 1735)    Mobility walks     Focused Assessments    R Recommendations: See Admitting Provider Note  Report given to:   Additional Notes:  +orthostatics, pt a&ox4, awaiting eval by PT

## 2022-10-04 NOTE — ED Notes (Signed)
Echo at bedside

## 2022-10-18 ENCOUNTER — Telehealth: Payer: Self-pay | Admitting: Internal Medicine

## 2022-10-18 NOTE — Telephone Encounter (Signed)
Someone who is not a patient of [facility] named Abby called and said she keeps getting called from [facility] for this patient. We called the other number, [phone], on the patient's file and it belongs to his cousin Reggie. He will have the patient call back to verify the [phone] number that belongs to Abby.

## 2022-10-25 ENCOUNTER — Ambulatory Visit: Payer: Medicare HMO | Admitting: Internal Medicine

## 2023-04-07 NOTE — Progress Notes (Signed)
 This encounter was created in error - please disregard.

## 2023-04-29 ENCOUNTER — Other Ambulatory Visit: Payer: Self-pay | Admitting: Internal Medicine

## 2023-05-01 ENCOUNTER — Other Ambulatory Visit: Payer: Self-pay

## 2023-06-14 ENCOUNTER — Telehealth: Payer: Self-pay

## 2023-06-14 NOTE — Progress Notes (Unsigned)
 Attempted to reach patient for [facility] outreach telephone visit - medication adherence review for Losartan and Rosuvastatin.   Left message with patient's cousin to return call at earliest convenience. Will route to embedded pharmacist for follow up.   Verdene Rio, PharmD PGY1 Pharmacy Resident

## 2023-07-21 ENCOUNTER — Telehealth: Payer: Self-pay

## 2023-07-21 NOTE — Telephone Encounter (Signed)
 This patient is appearing on a report for being at risk of failing the adherence measure for cholesterol (statin) and hypertension (ACEi/ARB) medications last calendar year.   Medication: Losartan  100 mg daily Last fill date: 09/26/22 for 90 day supply  Medication: Rosuvastatin  40 mg daily Last fill date: 01/18/23 for 90 day supply  Person other than patient answered the call who stated the patient does not own a phone but they take calls for him. They will let the patient know to give me a call back.    Abelina Abide, PharmD PGY1 Pharmacy Resident 07/21/2023 4:14 PM

## 2023-08-02 NOTE — Telephone Encounter (Signed)
 Same person answered the call who stated they would let the patient know to call back. Have left message with someone x3. At this point, unable to reach patient to discuss adherence.  Abelina Abide, PharmD PGY1 Pharmacy Resident 08/02/2023 1:37 PM

## 2023-09-18 ENCOUNTER — Encounter: Payer: Self-pay | Admitting: Internal Medicine

## 2023-09-18 ENCOUNTER — Ambulatory Visit (INDEPENDENT_AMBULATORY_CARE_PROVIDER_SITE_OTHER): Admitting: Internal Medicine

## 2023-09-18 ENCOUNTER — Ambulatory Visit: Payer: Self-pay | Admitting: Internal Medicine

## 2023-09-18 ENCOUNTER — Ambulatory Visit (INDEPENDENT_AMBULATORY_CARE_PROVIDER_SITE_OTHER)

## 2023-09-18 VITALS — BP 140/88 | HR 65 | Temp 98.4°F | Ht 71.0 in | Wt 158.0 lb

## 2023-09-18 DIAGNOSIS — R634 Abnormal weight loss: Secondary | ICD-10-CM

## 2023-09-18 DIAGNOSIS — F17200 Nicotine dependence, unspecified, uncomplicated: Secondary | ICD-10-CM | POA: Diagnosis not present

## 2023-09-18 DIAGNOSIS — N138 Other obstructive and reflux uropathy: Secondary | ICD-10-CM

## 2023-09-18 DIAGNOSIS — E559 Vitamin D deficiency, unspecified: Secondary | ICD-10-CM | POA: Diagnosis not present

## 2023-09-18 DIAGNOSIS — Z0001 Encounter for general adult medical examination with abnormal findings: Secondary | ICD-10-CM

## 2023-09-18 DIAGNOSIS — E538 Deficiency of other specified B group vitamins: Secondary | ICD-10-CM

## 2023-09-18 DIAGNOSIS — I10 Essential (primary) hypertension: Secondary | ICD-10-CM

## 2023-09-18 DIAGNOSIS — S22060A Wedge compression fracture of T7-T8 vertebra, initial encounter for closed fracture: Secondary | ICD-10-CM | POA: Diagnosis not present

## 2023-09-18 DIAGNOSIS — Z125 Encounter for screening for malignant neoplasm of prostate: Secondary | ICD-10-CM | POA: Diagnosis not present

## 2023-09-18 DIAGNOSIS — N401 Enlarged prostate with lower urinary tract symptoms: Secondary | ICD-10-CM | POA: Diagnosis not present

## 2023-09-18 DIAGNOSIS — S22050A Wedge compression fracture of T5-T6 vertebra, initial encounter for closed fracture: Secondary | ICD-10-CM | POA: Diagnosis not present

## 2023-09-18 DIAGNOSIS — E7800 Pure hypercholesterolemia, unspecified: Secondary | ICD-10-CM

## 2023-09-18 LAB — CBC WITH DIFFERENTIAL/PLATELET
Basophils Absolute: 0 10*3/uL (ref 0.0–0.1)
Basophils Relative: 0.5 % (ref 0.0–3.0)
Eosinophils Absolute: 0.2 10*3/uL (ref 0.0–0.7)
Eosinophils Relative: 3.1 % (ref 0.0–5.0)
HCT: 39.8 % (ref 39.0–52.0)
Hemoglobin: 13.1 g/dL (ref 13.0–17.0)
Lymphocytes Relative: 29.8 % (ref 12.0–46.0)
Lymphs Abs: 1.6 10*3/uL (ref 0.7–4.0)
MCHC: 33 g/dL (ref 30.0–36.0)
MCV: 82.4 fl (ref 78.0–100.0)
Monocytes Absolute: 0.4 10*3/uL (ref 0.1–1.0)
Monocytes Relative: 8 % (ref 3.0–12.0)
Neutro Abs: 3.2 10*3/uL (ref 1.4–7.7)
Neutrophils Relative %: 58.6 % (ref 43.0–77.0)
Platelets: 199 10*3/uL (ref 150.0–400.0)
RBC: 4.83 Mil/uL (ref 4.22–5.81)
RDW: 15.3 % (ref 11.5–15.5)
WBC: 5.5 10*3/uL (ref 4.0–10.5)

## 2023-09-18 LAB — URINALYSIS, ROUTINE W REFLEX MICROSCOPIC
Bilirubin Urine: NEGATIVE
Hgb urine dipstick: NEGATIVE
Ketones, ur: NEGATIVE
Leukocytes,Ua: NEGATIVE
Nitrite: NEGATIVE
RBC / HPF: NONE SEEN (ref 0–?)
Specific Gravity, Urine: 1.025 (ref 1.000–1.030)
Total Protein, Urine: NEGATIVE
Urine Glucose: NEGATIVE
Urobilinogen, UA: 0.2 (ref 0.0–1.0)
pH: 6 (ref 5.0–8.0)

## 2023-09-18 LAB — BASIC METABOLIC PANEL WITH GFR
BUN: 15 mg/dL (ref 6–23)
CO2: 25 meq/L (ref 19–32)
Calcium: 9.5 mg/dL (ref 8.4–10.5)
Chloride: 109 meq/L (ref 96–112)
Creatinine, Ser: 1.06 mg/dL (ref 0.40–1.50)
GFR: 68.81 mL/min (ref >60.00)
Glucose, Bld: 82 mg/dL (ref 70–99)
Potassium: 4.4 meq/L (ref 3.5–5.1)
Sodium: 141 meq/L (ref 135–145)

## 2023-09-18 LAB — LIPID PANEL
Cholesterol: 186 mg/dL (ref 0–200)
HDL: 47.7 mg/dL (ref >39.00)
LDL Cholesterol: 122 mg/dL — ABNORMAL HIGH (ref 0–99)
NonHDL: 138.11
Total CHOL/HDL Ratio: 4
Triglycerides: 79 mg/dL (ref 0.0–149.0)
VLDL: 15.8 mg/dL (ref 0.0–40.0)

## 2023-09-18 LAB — HEPATIC FUNCTION PANEL
ALT: 6 U/L (ref 0–53)
AST: 10 U/L (ref 0–37)
Albumin: 4 g/dL (ref 3.5–5.2)
Alkaline Phosphatase: 66 U/L (ref 39–117)
Bilirubin, Direct: 0.1 mg/dL (ref 0.0–0.3)
Total Bilirubin: 0.6 mg/dL (ref 0.2–1.2)
Total Protein: 6.9 g/dL (ref 6.0–8.3)

## 2023-09-18 LAB — TSH: TSH: 0.58 u[IU]/mL (ref 0.35–5.50)

## 2023-09-18 LAB — PSA: PSA: 2.31 ng/mL (ref 0.10–4.00)

## 2023-09-18 LAB — VITAMIN B12: Vitamin B-12: 238 pg/mL (ref 211–911)

## 2023-09-18 LAB — VITAMIN D 25 HYDROXY (VIT D DEFICIENCY, FRACTURES): VITD: 18.54 ng/mL — ABNORMAL LOW (ref 30.00–100.00)

## 2023-09-18 MED ORDER — LOSARTAN POTASSIUM 50 MG PO TABS: 50.0000 mg | ORAL_TABLET | Freq: Every day | ORAL | 3 refills | Status: AC

## 2023-09-18 MED ORDER — ROSUVASTATIN CALCIUM 40 MG PO TABS: 40.0000 mg | ORAL_TABLET | Freq: Every day | ORAL | 3 refills | Status: AC

## 2023-09-18 MED ORDER — CITALOPRAM HYDROBROMIDE 10 MG PO TABS: 10.0000 mg | ORAL_TABLET | Freq: Every day | ORAL | 3 refills | Status: AC

## 2023-09-18 MED ORDER — OXYBUTYNIN CHLORIDE ER 5 MG PO TB24: 5.0000 mg | ORAL_TABLET | Freq: Every day | ORAL | 3 refills | Status: AC

## 2023-09-18 MED ORDER — TAMSULOSIN HCL 0.4 MG PO CAPS: 0.4000 mg | ORAL_CAPSULE | Freq: Every day | ORAL | 3 refills | Status: AC

## 2023-09-18 NOTE — Patient Instructions (Signed)
 Ok to restart the Losartan  at lower dose 50 mg per day  Please continue all other medications as before, and refills have been done for all other meds  Please have the pharmacy call with any other refills you may need.  Please continue your efforts at being more active, low cholesterol diet, and weight control.  You are otherwise up to date with prevention measures today.  Please keep your appointments with your specialists as you may have planned  Please go to the XRAY Department in the first floor for the x-ray testing  Please go to the LAB at the blood drawing area for the tests to be done  Please make an Appointment to return in 6 months, or sooner if needed

## 2023-09-18 NOTE — Assessment & Plan Note (Signed)
 Lab Results  Component Value Date   VITAMINB12 197 (L) 04/26/2022   Low, reminded to start oral replacement - b12 1000 mcg qd

## 2023-09-18 NOTE — Assessment & Plan Note (Addendum)
 Age and sex appropriate education and counseling updated with regular exercise and diet Referrals for preventative services - none needed Immunizations addressed - declines all immunizations Smoking counseling  - pt counsled to quit, pt not ready Evidence for depression or other mood disorder - none significant Most recent labs reviewed. I have personally reviewed and have noted: 1) the patient's medical and social history 2) The patient's current medications and supplements 3) The patient's height, weight, and BMI have been recorded in the chart

## 2023-09-18 NOTE — Assessment & Plan Note (Signed)
 Worsening again, for UA with labs, and restart flomax  1 every day, consider urology if not improved

## 2023-09-18 NOTE — Assessment & Plan Note (Signed)
 Lab Results  Component Value Date   LDLCALC 120 (H) 04/25/2019   Uncontrolled, pt to restart crestor  40 mg qd

## 2023-09-18 NOTE — Progress Notes (Addendum)
 Patient ID: SEVAN MCBROOM, male   DOB: 11/28/1950, 73 y.o.   MRN: [id number]         Chief Complaint:: wellness exam and HTN overcontrolled, BPH, recent weight loss, smoker       HPI:  Jesse Williams is a 73 y.o. male here for wellness exam; declines all immunizations, o/w up to date.  Still smoking, not ready to quit, declines LDCT screening.                 Also has not been taking BP meds due to fatigue and slowed down with cutting grass.  Now out of all meds and worsening urinary stream has returned out of the flomax .  Has lost several lbs recently for unclear reason, and he hesitates to have a cxr today   Pt denies chest pain, increased sob or doe, wheezing, orthopnea, PND, increased LE swelling, palpitations, dizziness or syncope.   Pt denies polydipsia, polyuria, or new focal neuro s/s.    Pt denies  night sweats, loss of appetite, or other constitutional symptoms     Wt Readings from Last 3 Encounters:  09/18/23 158 lb (71.7 kg)  10/03/22 170 lb (77.1 kg)  04/26/22 167 lb (75.8 kg)   BP Readings from Last 3 Encounters:  09/18/23 (!) 140/88  10/04/22 (!) 155/79  04/26/22 (!) 142/90   Immunization History  Administered Date(s) Administered   Influenza-Unspecified 03/17/2010   PFIZER Comirnaty(Gray Top)Covid-19 Tri-Sucrose Vaccine 10/24/2019, 11/15/2019   Health Maintenance Due  Topic Date Due   DTaP/Tdap/Td (1 - Tdap) Never done   Pneumococcal Vaccine: 50+ Years (1 of 2 - PCV) Never done   Zoster Vaccines- Shingrix (1 of 2) Never done   Medicare Annual Wellness (AWV)  12/14/2021      Past Medical History:  Diagnosis Date   HLD (hyperlipidemia) 03/30/2017   Hypertension    History reviewed. No pertinent surgical history.  reports that he has been smoking. He has never used smokeless tobacco. He reports that he does not drink alcohol. No history on file for drug use. family history is not on file. No Known Allergies Current Outpatient Medications on File Prior to Visit   Medication Sig Dispense Refill   Cholecalciferol  50 MCG (2000 UT) TABS 1 tab by mouth once daily (Patient not taking: Reported on 09/18/2023) 30 tablet 99   polyethylene glycol powder (GLYCOLAX /MIRALAX ) powder Take 17 g by mouth 2 (two) times daily as needed. (Patient not taking: Reported on 09/18/2023) 289 g 1   No current facility-administered medications on file prior to visit.        ROS:  All others reviewed and negative.  Objective        PE:  BP (!) 140/88 (BP Location: Right Arm, Patient Position: Sitting, Cuff Size: Normal)   Pulse 65   Temp 98.4 F (36.9 C) (Oral)   Ht 5' 11 (1.803 m)   Wt 158 lb (71.7 kg)   SpO2 98%   BMI 22.04 kg/m                 Constitutional: Pt appears in NAD               HENT: Head: NCAT.                Right Ear: External ear normal.                 Left Ear: External ear normal.  Eyes: . Pupils are equal, round, and reactive to light. Conjunctivae and EOM are normal               Nose: without d/c or deformity               Neck: Neck supple. Gross normal ROM               Cardiovascular: Normal rate and regular rhythm.                 Pulmonary/Chest: Effort normal and breath sounds without rales or wheezing.                Abd:  Soft, NT, ND, + BS, no organomegaly               Neurological: Pt is alert. At baseline orientation, motor grossly intact               Skin: Skin is warm. No rashes, no other new lesions, LE edema - none               Psychiatric: Pt behavior is normal without agitation   Micro: none  Cardiac tracings I have personally interpreted today:  none  Pertinent Radiological findings (summarize): none   Lab Results  Component Value Date   WBC 6.0 10/03/2022   HGB 12.9 (L) 10/03/2022   HCT 38.8 (L) 10/03/2022   PLT 191 10/03/2022   GLUCOSE 77 10/04/2022   CHOL 194 04/25/2019   TRIG 71.0 04/25/2019   HDL 60.00 04/25/2019   LDLCALC 120 (H) 04/25/2019   ALT 12 10/03/2022   AST 16 10/03/2022   NA  140 10/04/2022   K 3.8 10/04/2022   CL 113 (H) 10/04/2022   CREATININE 1.08 10/04/2022   BUN 22 10/04/2022   CO2 22 10/04/2022   TSH 0.76 04/26/2022   PSA 2.81 04/26/2022   Assessment/Plan:  VIRAAT VANPATTEN is a 73 y.o. Black or African American [2] male with  has a past medical history of HLD (hyperlipidemia) (03/30/2017) and Hypertension.  TOBACCO USE Pt counseled to quit, and with wt loss also for cxr  Encounter for well adult exam with abnormal findings Age and sex appropriate education and counseling updated with regular exercise and diet Referrals for preventative services - none needed Immunizations addressed - declines all immunizations Smoking counseling  - pt counsled to quit, pt not ready Evidence for depression or other mood disorder - none significant Most recent labs reviewed. I have personally reviewed and have noted: 1) the patient's medical and social history 2) The patient's current medications and supplements 3) The patient's height, weight, and BMI have been recorded in the chart   Vitamin D  deficiency Last vitamin D  Lab Results  Component Value Date   VD25OH 13.00 (L) 04/26/2022   Low, reminded to start oral replacement   Hypertension, uncontrolled BP Readings from Last 3 Encounters:  09/18/23 (!) 140/88  10/04/22 (!) 155/79  04/26/22 (!) 142/90   uncontrolled, pt to continue medical treatment lower dose 50 mg losartan , f/u BP at home and next visit   HLD (hyperlipidemia) Lab Results  Component Value Date   LDLCALC 120 (H) 04/25/2019   Uncontrolled, pt to restart crestor  40 mg qd   BPH with obstruction/lower urinary tract symptoms Worsening again, for UA with labs, and restart flomax  1 every day, consider urology if not improved  B12 deficiency Lab Results  Component Value Date   VITAMINB12 197 (L) 04/26/2022  Low, reminded to start oral replacement - b12 1000 mcg qd  Followup: Return in about 6 months (around 03/19/2024).  Lynwood Rush, MD 09/18/2023 1:02 PM [facility]

## 2023-09-18 NOTE — Assessment & Plan Note (Signed)
 BP Readings from Last 3 Encounters:  09/18/23 (!) 140/88  10/04/22 (!) 155/79  04/26/22 (!) 142/90   uncontrolled, pt to continue medical treatment lower dose 50 mg losartan , f/u BP at home and next visit

## 2023-09-18 NOTE — Assessment & Plan Note (Signed)
 Last vitamin D  Lab Results  Component Value Date   VD25OH 13.00 (L) 04/26/2022   Low, reminded to start oral replacement

## 2023-09-18 NOTE — Assessment & Plan Note (Signed)
 Pt counseled to quit, and with wt loss also for cxr

## 2024-01-05 ENCOUNTER — Encounter: Payer: Self-pay | Admitting: *Deleted

## 2024-01-05 NOTE — Progress Notes (Signed)
 NALU TROUBLEFIELD                                          MRN: [id number]   01/05/2024   The [facility] Quality Team Specialist reviewed this patient medical record for the purposes of chart review for care gap closure. The following were reviewed: chart review for care gap closure-controlling blood pressure.    [facility] Quality Team

## 2024-01-30 ENCOUNTER — Ambulatory Visit (HOSPITAL_COMMUNITY): Admission: EM | Admit: 2024-01-30 | Discharge: 2024-01-30 | Disposition: A | Discharge status: Home / Self Care

## 2024-01-30 ENCOUNTER — Encounter (HOSPITAL_COMMUNITY): Payer: Self-pay

## 2024-01-30 DIAGNOSIS — S161XXA Strain of muscle, fascia and tendon at neck level, initial encounter: Secondary | ICD-10-CM | POA: Diagnosis not present

## 2024-01-30 DIAGNOSIS — K5900 Constipation, unspecified: Secondary | ICD-10-CM | POA: Diagnosis not present

## 2024-01-30 MED ORDER — POLYETHYLENE GLYCOL 3350 17 GM/SCOOP PO POWD: 17.0000 g | Freq: Every day | ORAL | 0 refills | Status: AC

## 2024-01-30 MED ORDER — KETOROLAC TROMETHAMINE 30 MG/ML IJ SOLN
30.0000 mg | Freq: Once | INTRAMUSCULAR | Status: AC
  Administered 2024-01-30: 30 mg via INTRAMUSCULAR

## 2024-01-30 MED ORDER — NAPROXEN 500 MG PO TABS: 500.0000 mg | ORAL_TABLET | Freq: Two times a day (BID) | ORAL | 0 refills | Status: AC

## 2024-01-30 MED ORDER — KETOROLAC TROMETHAMINE 30 MG/ML IJ SOLN
INTRAMUSCULAR | Status: AC
  Filled 2024-01-30: qty 1

## 2024-01-30 NOTE — Discharge Instructions (Addendum)
 Increase water intake, increase fresh fruit and vegetable intake, increase exercise can be as little as exercise to help with constipation.  May use MiraLAX  once a day mixed into a drink of your choice double constipation.  May use ice for the first 24 hours on neck to help with inflammation and pain, used cool compress 20 minutes at a time a couple times a day then switch to warm compress 20 minutes a day a couple times a day after the first 24 hours.  May use naproxen that was prescribed as needed for pain.

## 2024-01-30 NOTE — ED Provider Notes (Signed)
 [facility]    CSN: [id number] Arrival date & time: 01/30/24  1000      History   Chief Complaint Chief Complaint  Patient presents with   Neck Pain   Abdominal Pain    HPI Jesse Williams is a 73 y.o. male.   Patient presents today due to 10/10 pain that starts from his lower abdomen that radiates all the way up to his neck.  Patient states that he cannot move his neck around.  Patient states that he slipped off the bed today and fell on the floor and it took him about an hour to get up off the floor.  Patient denies use of medication for his pain because he is not a pill taker.  Patient states that he feels that he is constipated, last good with bowel movement was last Thursday or Friday.  Patient states he attempted to take a laxative cannot remember the name of it without any significant relief.  The history is provided by the patient.  Neck Pain Abdominal Pain   Past Medical History:  Diagnosis Date   HLD (hyperlipidemia) 03/30/2017   Hypertension     Patient Active Problem List   Diagnosis Date Noted   Syncope, vasovagal 10/03/2022   Syncope 10/03/2022   OAB (overactive bladder) 04/26/2021   Cough 04/26/2021   B12 deficiency 04/26/2021   Abdominal pain 09/29/2020   Pain in joint involving shoulder region 09/29/2020   Constipation, unspecified 09/29/2020   Vitamin D  deficiency 09/29/2020   Thoracic spine fracture (HCC) 09/29/2020   Aortic atherosclerosis 09/29/2020   Depression 12/19/2017   Encounter for well adult exam with abnormal findings 03/30/2017   HLD (hyperlipidemia) 03/30/2017   Constipation 03/01/2010   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 No pertinent surgical history.     Home Medications    Prior to Admission medications   Medication Sig Start Date End Date Taking? Authorizing Provider  naproxen (NAPROSYN) 500 MG tablet Take 1 tablet (500 mg total) by mouth 2 (two) times daily. 01/30/24  Yes Andra Corean BROCKS, PA-C  Cholecalciferol  50 MCG (2000 UT) TABS 1 tab by mouth once daily Patient not taking: Reported on 09/18/2023 09/29/20   Norleen Lynwood ORN, MD  citalopram  (CELEXA ) 10 MG tablet Take 1 tablet (10 mg total) by mouth daily. 09/18/23   Norleen Lynwood ORN, MD  losartan  (COZAAR ) 50 MG tablet Take 1 tablet (50 mg total) by mouth daily. 09/18/23   Norleen Lynwood ORN, MD  oxybutynin  (DITROPAN -XL) 5 MG 24 hr tablet Take 1 tablet (5 mg total) by mouth at bedtime. 09/18/23   Norleen Lynwood ORN, MD  polyethylene glycol powder (GLYCOLAX /MIRALAX ) 17 GM/SCOOP powder Take 17 g by mouth daily. 01/30/24   Andra Corean BROCKS, PA-C  rosuvastatin  (CRESTOR ) 40 MG tablet Take 1 tablet (40 mg total) by mouth daily. 09/18/23   Norleen Lynwood ORN, MD  tamsulosin  (FLOMAX ) 0.4 MG CAPS capsule Take 1 capsule (0.4 mg total) by mouth daily. 09/18/23   Norleen Lynwood ORN, MD    Family History History reviewed. No pertinent family history.  Social History Social History   Tobacco Use  Smoking status: Every Day    Current packs/day: 1.00    Types: Cigarettes   Smokeless tobacco: Never  Vaping Use   Vaping status: Never Used  Substance Use Topics   Alcohol use: No    Comment: social use   Drug use: Yes    Comment: crack     Allergies   Patient has no known allergies.   Review of Systems Review of Systems  Gastrointestinal:  Positive for abdominal pain.  Musculoskeletal:  Positive for neck pain.     Physical Exam Triage Vital Signs ED Triage Vitals  Encounter Vitals Group     BP 01/30/24 1016 (!) 155/93     Girls Systolic BP Percentile --      Girls Diastolic BP  Percentile --      Boys Systolic BP Percentile --      Boys Diastolic BP Percentile --      Pulse Rate 01/30/24 1016 78     Resp 01/30/24 1016 16     Temp 01/30/24 1016 97.6 F (36.4 C)     Temp Source 01/30/24 1016 Oral     SpO2 01/30/24 1016 97 %     Weight --      Height --      Head Circumference --      Peak Flow --      Pain Score 01/30/24 1015 9     Pain Loc --      Pain Education --      Exclude from Growth Chart --    No data found.  Updated Vital Signs BP (!) 155/93 (BP Location: Left Arm)   Pulse 78   Temp 97.6 F (36.4 C) (Oral)   Resp 16   SpO2 97%   Visual Acuity Right Eye Distance:   Left Eye Distance:   Bilateral Distance:    Right Eye Near:   Left Eye Near:    Bilateral Near:     Physical Exam Vitals and nursing note reviewed.  Constitutional:      General: He is not in acute distress.    Appearance: Normal appearance. He is well-developed. He is not ill-appearing, toxic-appearing or diaphoretic.  HENT:     Head:     Comments: Reduced flexion, extension, right and left lateral rotation of neck.  Tenderness to palpation of left cervical paraspinous muscles  Eyes:     General: No scleral icterus. Cardiovascular:     Rate and Rhythm: Normal rate and regular rhythm.     Heart sounds: Normal heart sounds.  Pulmonary:     Effort: Pulmonary effort is normal. No respiratory distress.     Breath sounds: Normal breath sounds. No wheezing or rhonchi.  Skin:    General: Skin is warm.  Neurological:     Mental Status: He is alert and oriented to person, place, and time.  Psychiatric:        Mood and Affect: Mood normal.        Behavior: Behavior normal.      UC Treatments / Results  Labs (all labs ordered are listed, but only abnormal results are displayed) Labs Reviewed - No data to display  EKG   Radiology No results found.  Procedures Procedures (including critical care time)  Medications Ordered in UC Medications  ketorolac  (TORADOL) 30 MG/ML injection 30 mg (has no administration in time range)    Initial Impression / Assessment and Plan / UC Course  I have reviewed the triage vital signs  and the nursing notes.  Pertinent labs & imaging results that were available during my care of the patient were reviewed by me and considered in my medical decision making (see chart for details).    Final Clinical Impressions(s) / UC Diagnoses   Final diagnoses:  Neck strain, initial encounter  Constipation, unspecified constipation type     Discharge Instructions      Increase water intake, increase fresh fruit and vegetable intake, increase exercise can be as little as exercise to help with constipation.  May use MiraLAX  once a day mixed into a drink of your choice double constipation.  May use ice for the first 24 hours on neck to help with inflammation and pain, used cool compress 20 minutes at a time a couple times a day then switch to warm compress 20 minutes a day a couple times a day after the first 24 hours.  May use naproxen that was prescribed as needed for pain.     ED Prescriptions     Medication Sig Dispense Auth. Provider   polyethylene glycol powder (GLYCOLAX /MIRALAX ) 17 GM/SCOOP powder Take 17 g by mouth daily. 289 g Senna Lape C, PA-C   naproxen (NAPROSYN) 500 MG tablet Take 1 tablet (500 mg total) by mouth 2 (two) times daily. 30 tablet Andra Corean BROCKS, PA-C      PDMP not reviewed this encounter.   Andra Corean BROCKS, PA-C 01/30/24 1042

## 2024-01-30 NOTE — ED Triage Notes (Signed)
 Patient reports that he woke up 4 days ago with left lateral neck pain and is unable to turn his neck from side to side.  Patient states he began having pain in his right hip a week ago , but has resolved. Pain is in his left lower abdomen now and has pain when he gets up and down. Patient thinks he may be constipated. Patient states he had a small BM yesterday. Patient states he can not find his Feenamint.  Patient denies taking any medication for his symptoms.

## 2024-03-15 NOTE — Progress Notes (Signed)
 Jesse Williams                                          MRN: [id number]   03/15/2024   The [facility] Quality Team Specialist reviewed this patient medical record for the purposes of chart review for care gap closure. The following were reviewed: chart review for care gap closure-controlling blood pressure.    [facility] Quality Team

## 2024-04-17 NOTE — Progress Notes (Signed)
 Jesse Williams                                          MRN: [id number]   04/17/2024   The [facility] Quality Team Specialist reviewed this patient medical record for the purposes of chart review for care gap closure. The following were reviewed: chart review for care gap closure-controlling blood pressure.    [facility] Quality Team
